# Patient Record
Sex: Male | Born: 1960 | Race: Black or African American | Hispanic: No | Marital: Married | Smoking: Never smoker
Health system: Southern US, Community
[De-identification: ages and names within clinical notes are randomized; demographics above are authoritative.]

## PROBLEM LIST (undated history)

## (undated) DIAGNOSIS — M549 Dorsalgia, unspecified: Secondary | ICD-10-CM

## (undated) DIAGNOSIS — I2699 Other pulmonary embolism without acute cor pulmonale: Secondary | ICD-10-CM

## (undated) DIAGNOSIS — N529 Male erectile dysfunction, unspecified: Secondary | ICD-10-CM

## (undated) DIAGNOSIS — I10 Essential (primary) hypertension: Secondary | ICD-10-CM

## (undated) DIAGNOSIS — R42 Dizziness and giddiness: Secondary | ICD-10-CM

## (undated) DIAGNOSIS — R7303 Prediabetes: Secondary | ICD-10-CM

## (undated) DIAGNOSIS — G4700 Insomnia, unspecified: Secondary | ICD-10-CM

## (undated) DIAGNOSIS — R9431 Abnormal electrocardiogram [ECG] [EKG]: Secondary | ICD-10-CM

## (undated) DIAGNOSIS — E7800 Pure hypercholesterolemia, unspecified: Secondary | ICD-10-CM

## (undated) DIAGNOSIS — G4733 Obstructive sleep apnea (adult) (pediatric): Secondary | ICD-10-CM

## (undated) HISTORY — PX: COLONOSCOPY: SHX174

## (undated) HISTORY — DX: Insomnia, unspecified: G47.00

## (undated) HISTORY — DX: Male erectile dysfunction, unspecified: N52.9

## (undated) HISTORY — DX: Dorsalgia, unspecified: M54.9

## (undated) HISTORY — DX: Obstructive sleep apnea (adult) (pediatric): G47.33

## (undated) HISTORY — DX: Abnormal electrocardiogram (ECG) (EKG): R94.31

## (undated) HISTORY — DX: Pure hypercholesterolemia, unspecified: E78.00

## (undated) HISTORY — DX: Dizziness and giddiness: R42

## (undated) HISTORY — DX: Prediabetes: R73.03

## (undated) HISTORY — PX: BACK SURGERY: SHX140

---

## 2008-08-05 ENCOUNTER — Encounter: Admission: RE | Admit: 2008-08-05 | Discharge: 2008-08-05 | Payer: Self-pay | Admitting: Orthopedic Surgery

## 2008-08-05 ENCOUNTER — Encounter: Payer: Self-pay | Admitting: Orthopedic Surgery

## 2008-09-18 ENCOUNTER — Encounter
Admission: RE | Admit: 2008-09-18 | Discharge: 2008-09-18 | Payer: Self-pay | Admitting: Physical Medicine and Rehabilitation

## 2010-02-07 ENCOUNTER — Encounter: Payer: Self-pay | Admitting: Orthopedic Surgery

## 2010-07-29 IMAGING — CT CT L SPINE W/O CM
4 of 7 series · 15 of 33 positions shown, 17 images · non-contrast
Comparison: MRI 08/05/2008

CLINICAL DATA: Low back pain.  Numbness and weakness of the legs.
Post discogram evaluation.

CT LUMBAR SPINE WITHOUT CONTRAST
TECHNIQUE: Multidetector CT imaging of the lumbar spine was
performed without intravenous contrast administration. Multiplanar
CT image reconstructions were also generated.

[Series 2: l-spine helical · axial · 0.27mm/px · z∈[-130,-47]mm · 4 of 56 slices shown, 5 images]
[im 12/56  soft-tissue]
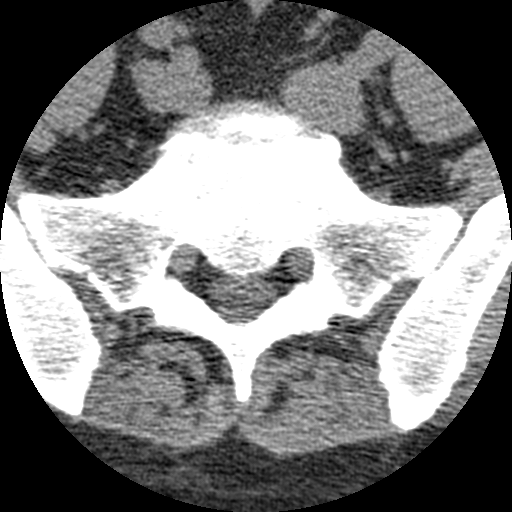
[im 12/56  bone]
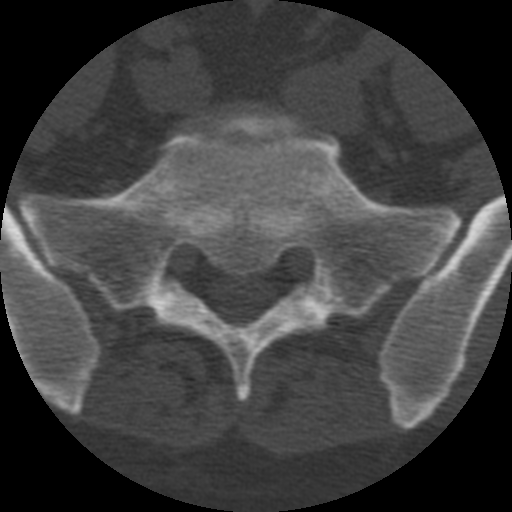
[im 23/56  bone]
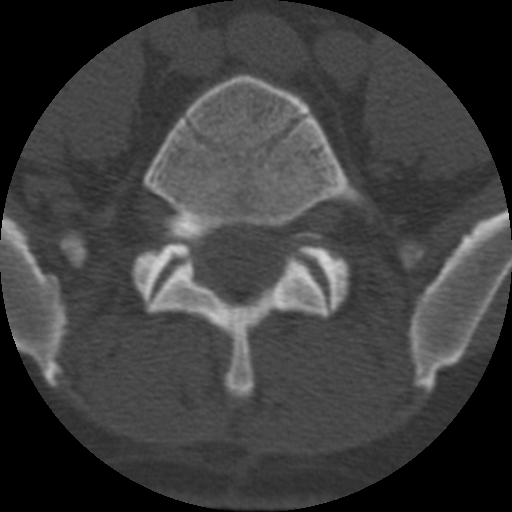
[im 34/56  bone]
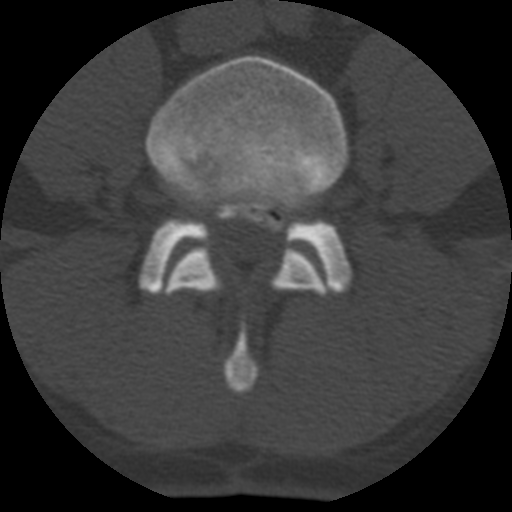
[im 45/56  bone]
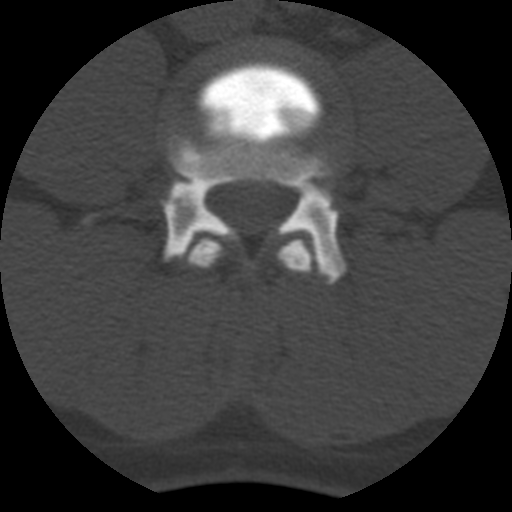

[Series 3: bone windows · axial · 0.27mm/px · z∈[-122,-52]mm · 3 of 57 slices shown]
[im 15/57  bone]
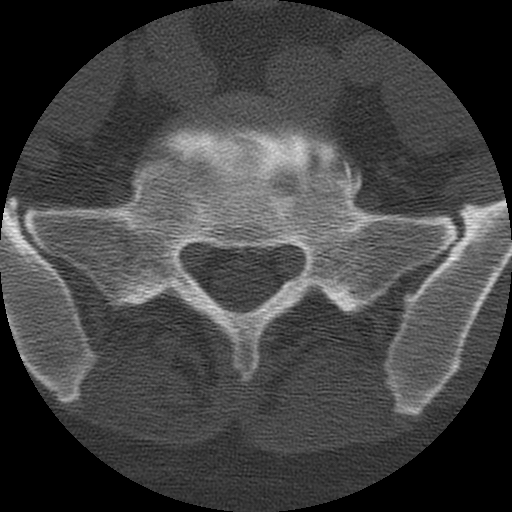
[im 29/57  bone]
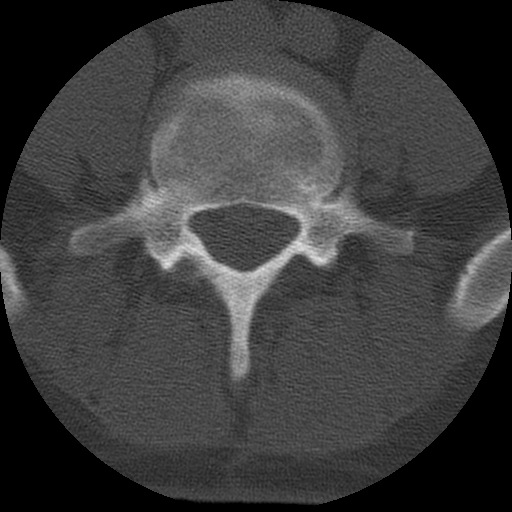
[im 43/57  bone]
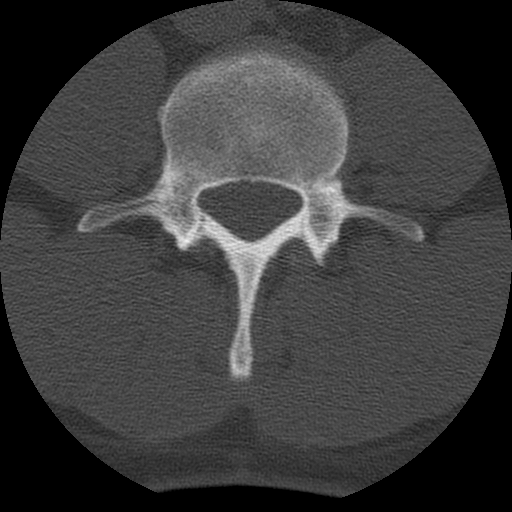

[Series 400: coronal · coronal · 0.27mm/px · 3 of 40 slices shown]
[im 8/40  bone]
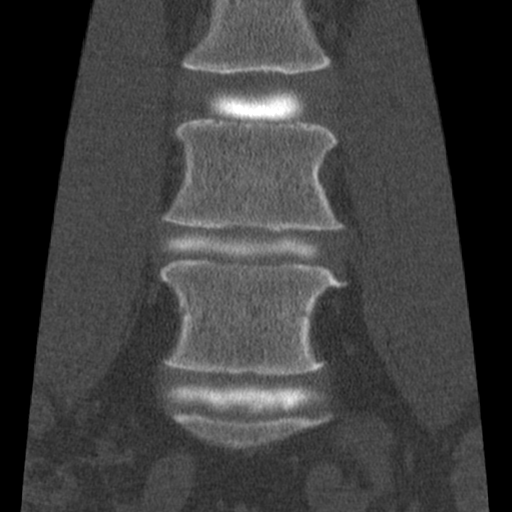
[im 16/40  bone]
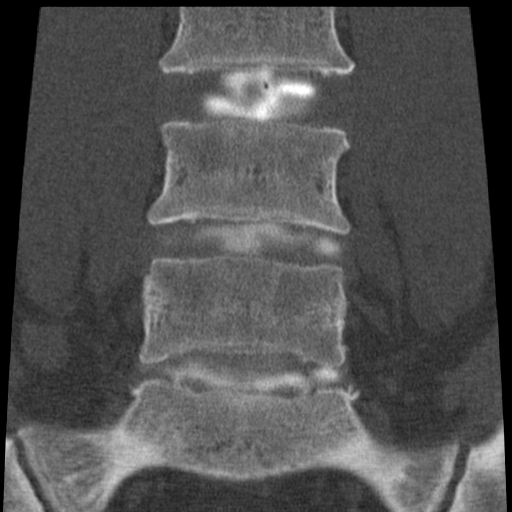
[im 24/40  bone]
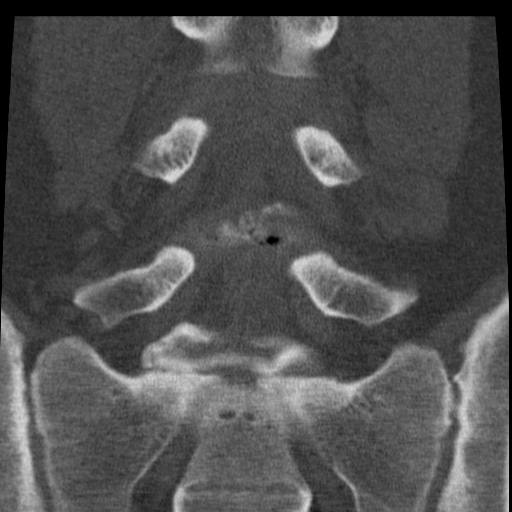

[Series 401: sagittal · sagittal · 0.27mm/px · 5 of 40 slices shown, 6 images]
[im 14/40  bone]
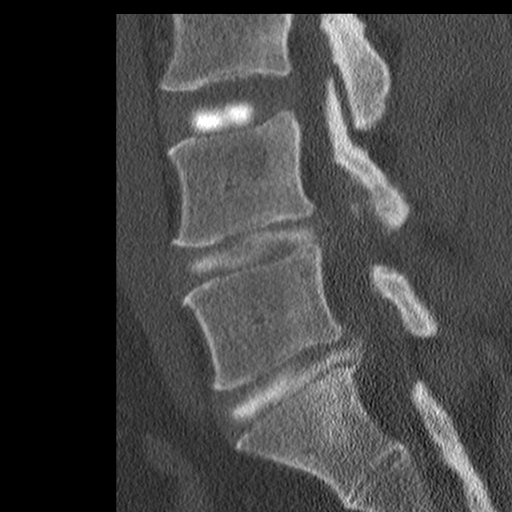
[im 17/40  bone]
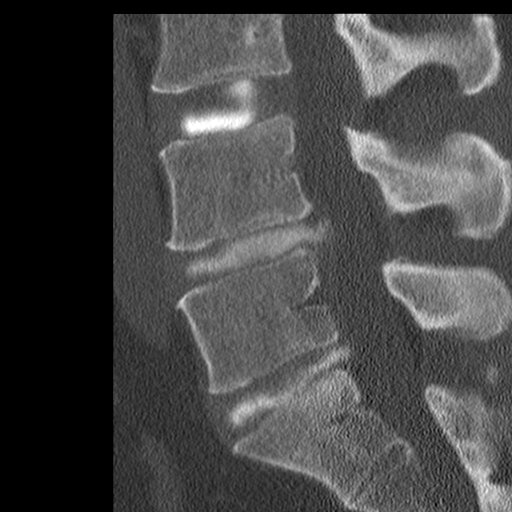
[im 20/40  soft-tissue]
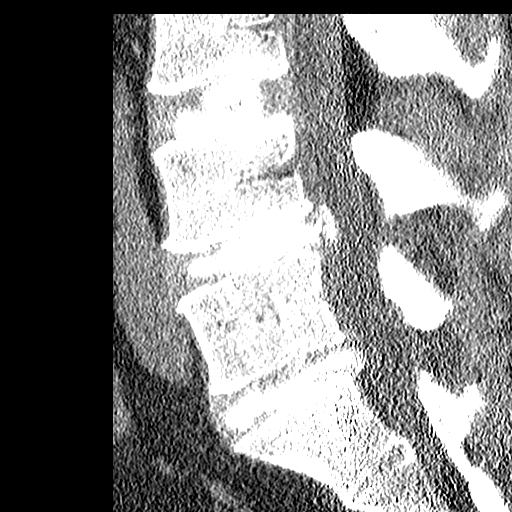
[im 20/40  bone]
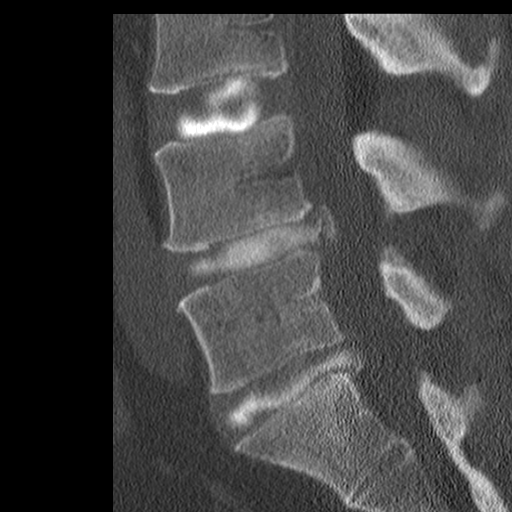
[im 23/40  bone]
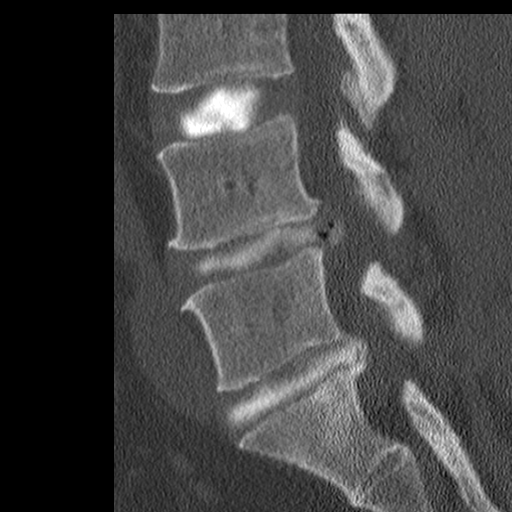
[im 27/40  bone]
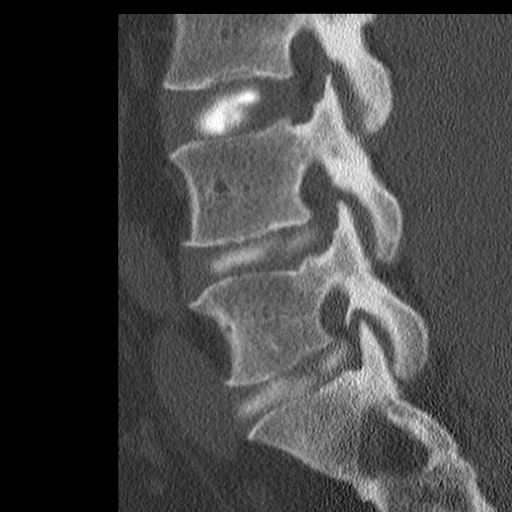

[15 of 33 positions shown; findings below may reference images not displayed]

FINDINGS: L3-4:  Normal nuclear pattern.  The canal and foramina
are widely patent.  There is mild facet degeneration.

L4-5:  This is a diffusely disrupted disc with annular tearing
posteriorly.  There is extrusion of disc material and injectate
that encroaches upon the canal.  There is mild facet degeneration.
Extrusion is more prominent towards the left.

L5-S1:  This is a diffusely disrupted disc with annular tearing and
both posterolateral direction ends.  There is extrusion of disc
material and injectate into both neural foramina, more extensive on
the right than the left.
IMPRESSION: L3-4:  Normal

L4-5:  Diffusely disrupted disc.  Posterior annular tearing with
extrusion of disc material in injectate, more towards the left.

L5-S1:  Diffusely disrupted disc.  Posterior annular tearing with
extrusion of disc material in injectate into both foraminal
regions, more extensive on the right than the left.

## 2010-12-07 ENCOUNTER — Emergency Department (INDEPENDENT_AMBULATORY_CARE_PROVIDER_SITE_OTHER): Payer: Federal, State, Local not specified - PPO

## 2010-12-07 ENCOUNTER — Emergency Department (INDEPENDENT_AMBULATORY_CARE_PROVIDER_SITE_OTHER)
Admission: EM | Admit: 2010-12-07 | Discharge: 2010-12-07 | Disposition: A | Discharge status: Home / Self Care | Payer: Federal, State, Local not specified - PPO | Source: Home / Self Care | Attending: Family Medicine | Admitting: Family Medicine

## 2010-12-07 ENCOUNTER — Other Ambulatory Visit: Payer: Self-pay

## 2010-12-07 DIAGNOSIS — R030 Elevated blood-pressure reading, without diagnosis of hypertension: Secondary | ICD-10-CM

## 2010-12-07 NOTE — ED Provider Notes (Signed)
History     CSN: [id number] Arrival date & time: 12/07/2010  6:30 PM   First MD Initiated Contact with Patient 12/07/10 1753      Chief Complaint  Patient presents with  . Shortness of Breath    (Consider location/radiation/quality/duration/timing/severity/associated sxs/prior treatment) HPI Comments: Deng presents for further evaluation of elevated blood pressure found on a screening exam; he denies any hx of HTN and had an apparently normal vitals and workup. He now endorses dyspnea on exertion; he reports poor sleep, snoring. He reports that his wife is currently treated for sleep apnea, he reports daytime sleepiness. He denies tobacco or alcohol.   Patient is a 50 y.o. male presenting with shortness of breath.  Shortness of Breath  The current episode started more than 1 week ago. The problem occurs occasionally. The problem has been unchanged. The problem is moderate. The symptoms are relieved by rest. The symptoms are aggravated by activity and a supine position. Associated symptoms include shortness of breath. Pertinent negatives include no chest pain and no chest pressure. There was no intake of a foreign body.    History reviewed. No pertinent past medical history.  Past Surgical History  Procedure Date  . Back surgery     History reviewed. No pertinent family history.  History  Substance Use Topics  . Smoking status: Never Smoker   . Smokeless tobacco: Not on file  . Alcohol Use: No      Review of Systems  Constitutional: Negative.   Eyes: Negative.   Respiratory: Positive for shortness of breath. Negative for chest tightness.   Cardiovascular: Negative for chest pain.  Genitourinary: Negative.   Musculoskeletal: Negative.   Neurological: Positive for headaches.    Allergies  Review of patient's allergies indicates no known allergies.  Home Medications  No current outpatient prescriptions on file.  BP 155/90  Pulse 76  Temp(Src) 98.9 F (37.2 C)  (Oral)  Resp 20  SpO2 100%  Physical Exam  Constitutional: He is oriented to person, place, and time. He appears well-developed and well-nourished.  HENT:  Head: Normocephalic and atraumatic.  Right Ear: Tympanic membrane normal.  Left Ear: Tympanic membrane normal.  Mouth/Throat: Uvula is midline and oropharynx is clear and moist.  Eyes: EOM are normal.  Neck: Normal range of motion.  Cardiovascular: Normal rate and regular rhythm.   Pulmonary/Chest: Effort normal and breath sounds normal. He has no wheezes. He has no rhonchi.  Neurological: He is alert and oriented to person, place, and time.  Skin: Skin is warm and dry.    ED Course  Procedures (including critical care time)  Labs Reviewed - No data to display Dg Chest 2 View  12/07/2010  *RADIOLOGY REPORT*  Clinical Data: Shortness of breath  CHEST - 2 VIEW  Comparison: None.  Findings: Low lung volumes.  Lungs are essentially clear. No pleural effusion or pneumothorax.  Cardiomediastinal silhouette is within normal limits.  Visualized osseous structures are within normal limits.  IMPRESSION: No evidence of acute cardiopulmonary disease.  Original Report Authenticated By: Charline Bills, M.D.     No diagnosis found.    MDM  CXR: no acute findings or disease ECG: NSR, rate 62, no acute ST-T changes        Richardo Priest, MD 12/07/10 2115

## 2010-12-07 NOTE — ED Notes (Signed)
Patient states he is easy to get SOB past couple of weeks or so, worse when he exerts himself; his BP , CBG were elevated at an office sponsored health fair earlier today, aprox 2 h after consuming breakfast combo at [employer]

## 2012-10-16 IMAGING — CR DG CHEST 2V
2 series · 2 of 2 positions shown · non-contrast
Comparison: None.

CLINICAL DATA: Shortness of breath

CHEST - 2 VIEW

[view not recorded (1 of 2)]
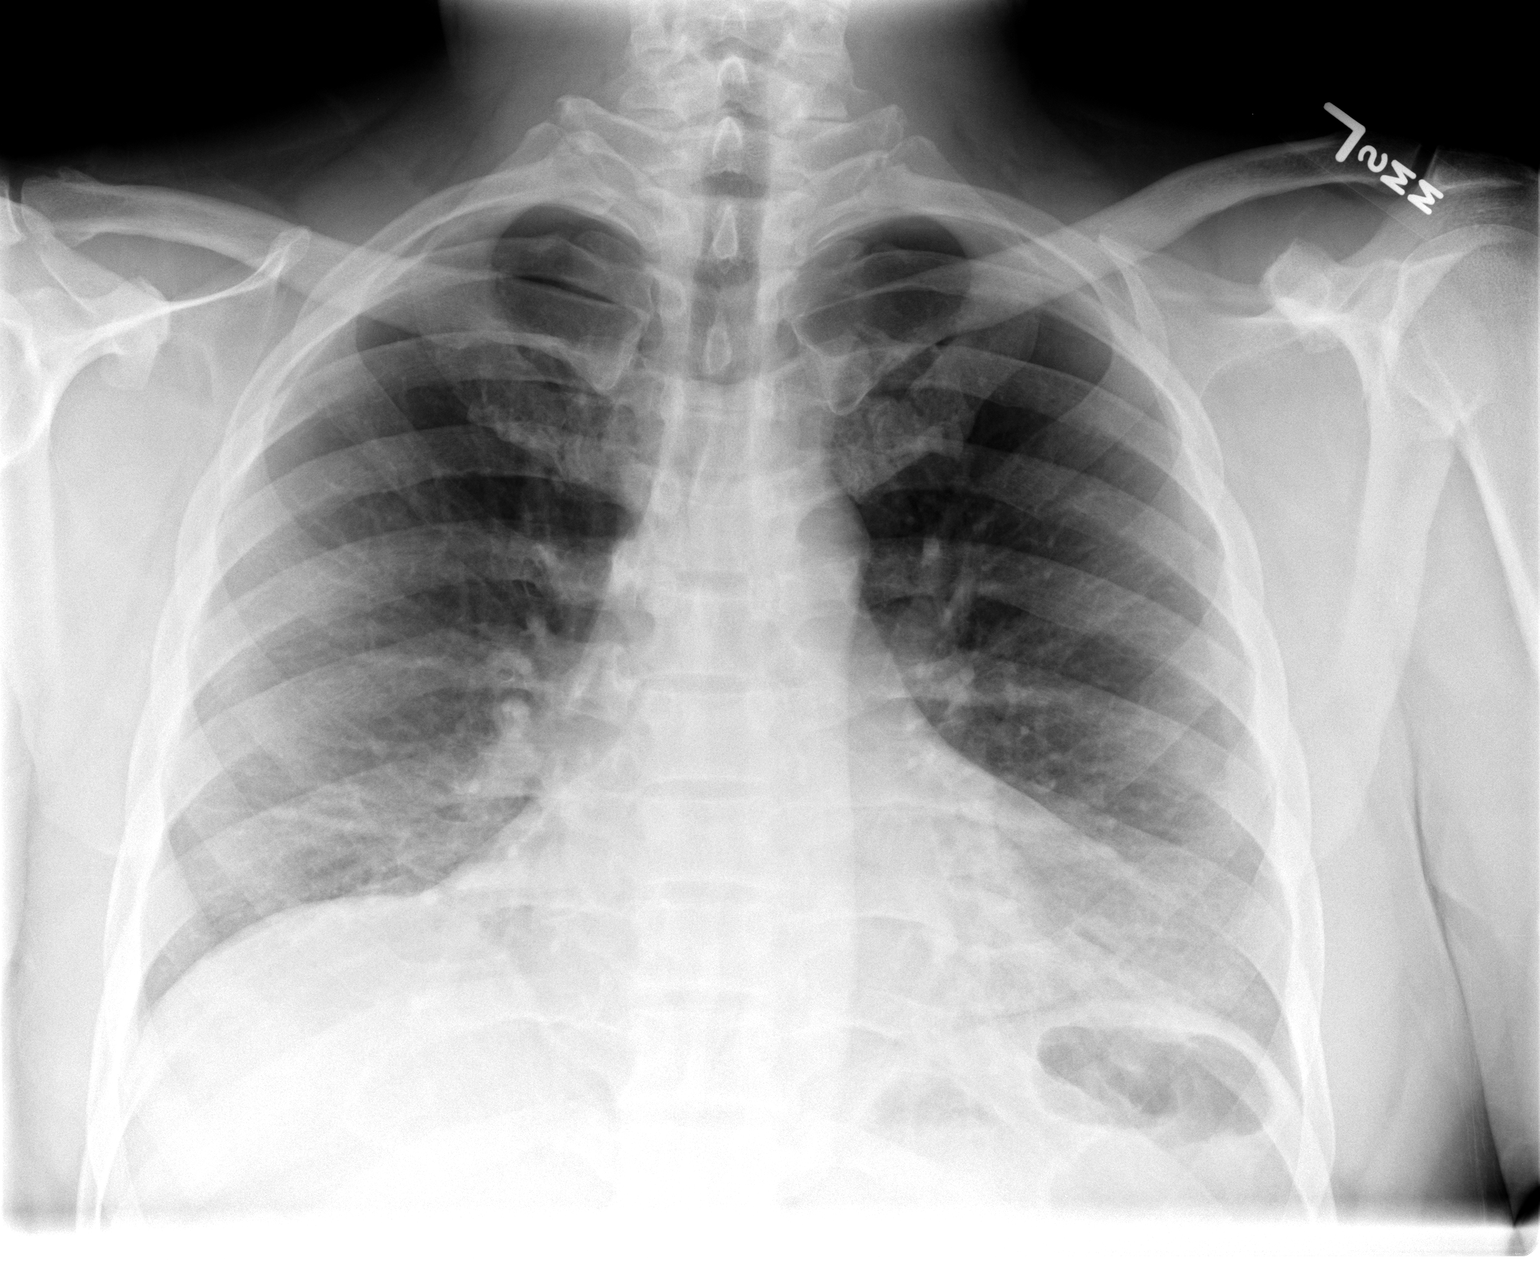

[view not recorded (2 of 2)]
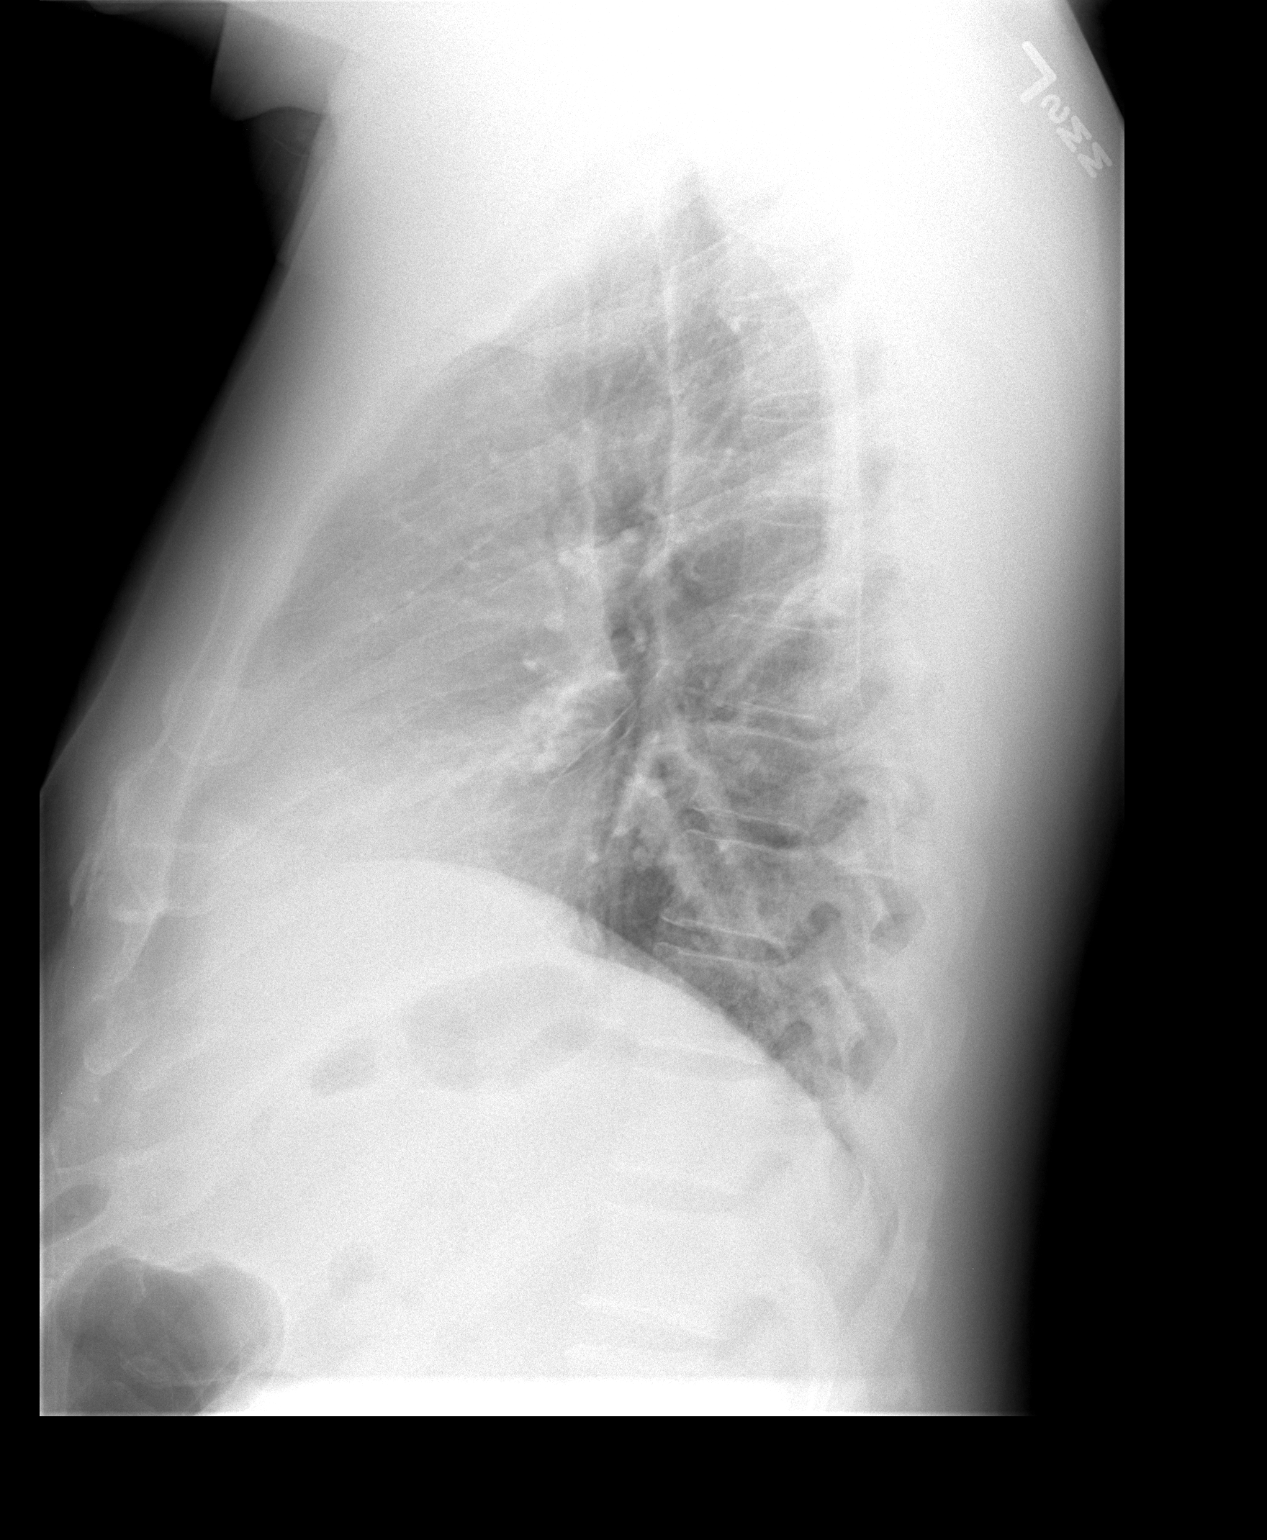

[2 of 2 positions shown; findings below may reference images not displayed]

FINDINGS: Low lung volumes.  Lungs are essentially clear. No
pleural effusion or pneumothorax.

Cardiomediastinal silhouette is within normal limits.

Visualized osseous structures are within normal limits.
IMPRESSION: No evidence of acute cardiopulmonary disease.

## 2014-11-02 ENCOUNTER — Emergency Department (HOSPITAL_COMMUNITY)
Admission: EM | Admit: 2014-11-02 | Discharge: 2014-11-02 | Disposition: A | Discharge status: Home / Self Care | Payer: Worker's Compensation | Attending: Emergency Medicine | Admitting: Emergency Medicine

## 2014-11-02 ENCOUNTER — Encounter (HOSPITAL_COMMUNITY): Payer: Self-pay | Admitting: Emergency Medicine

## 2014-11-02 DIAGNOSIS — Y9389 Activity, other specified: Secondary | ICD-10-CM | POA: Insufficient documentation

## 2014-11-02 DIAGNOSIS — I10 Essential (primary) hypertension: Secondary | ICD-10-CM | POA: Insufficient documentation

## 2014-11-02 DIAGNOSIS — S61236A Puncture wound without foreign body of right little finger without damage to nail, initial encounter: Secondary | ICD-10-CM | POA: Insufficient documentation

## 2014-11-02 DIAGNOSIS — Y9289 Other specified places as the place of occurrence of the external cause: Secondary | ICD-10-CM | POA: Diagnosis not present

## 2014-11-02 DIAGNOSIS — Z79899 Other long term (current) drug therapy: Secondary | ICD-10-CM | POA: Diagnosis not present

## 2014-11-02 DIAGNOSIS — W461XXA Contact with contaminated hypodermic needle, initial encounter: Secondary | ICD-10-CM | POA: Diagnosis not present

## 2014-11-02 DIAGNOSIS — Y998 Other external cause status: Secondary | ICD-10-CM | POA: Insufficient documentation

## 2014-11-02 DIAGNOSIS — Z7721 Contact with and (suspected) exposure to potentially hazardous body fluids: Secondary | ICD-10-CM

## 2014-11-02 HISTORY — DX: Essential (primary) hypertension: I10

## 2014-11-02 LAB — RAPID HIV SCREEN (HIV 1/2 AB+AG)
HIV 1/2 ANTIBODIES: NONREACTIVE
HIV-1 P24 ANTIGEN - HIV24: NONREACTIVE
INTERPRETATION (HIV AG AB): NONREACTIVE

## 2014-11-02 LAB — CBG MONITORING, ED
GLUCOSE-CAPILLARY: 114 mg/dL — AB (ref 65–99)
Glucose-Capillary: 78 mg/dL (ref 65–99)

## 2014-11-02 NOTE — ED Provider Notes (Signed)
CSN: [id number]     Arrival date & time 11/02/14  1016 History  By signing my name below, I, Wayne Cox, attest that this documentation has been prepared under the direction and in the presence of Wayne BaptistEmily Roe Nguyen, MD. Electronically Signed: Ronney LionSuzanne Cox, ED Scribe. 11/02/2014. 11:22 AM.   Chief Complaint  Patient presents with  . Body Fluid Exposure   The history is provided by the patient. No language interpreter was used.    HPI Comments: Wayne Cox is a 54 y.o. male with a history of hypertension, who presents to the Emergency Department complaining of accidentally getting stuck by a needle this morning. 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 He does not know anything about the tenant's past medical history but does state she is elderly. He continues to state that he is scared about the possibility of disease transmission.  Past Medical History  Diagnosis Date  . Hypertension    Past Surgical History  Procedure Laterality Date  . Back surgery     No family history on file. Social History  Substance Use Topics  . Smoking status: Never Smoker   . Smokeless tobacco: None  . Alcohol Use: No    Review of Systems  All other systems reviewed and are negative.  Allergies  Review of patient's allergies indicates no known allergies.  Home Medications   Prior to Admission medications   Medication Sig Start Date End Date Taking? Authorizing Provider  amLODipine (NORVASC) 10 MG tablet Take 1 tablet by mouth daily. 10/27/14  Yes Historical Provider, MD  Eszopiclone 3 MG TABS Take 1 tablet by mouth daily as needed (sleep).  10/29/14  Yes Historical Provider, MD  losartan (COZAAR) 100 MG tablet Take 1 tablet by mouth daily. 10/27/14  Yes Historical Provider, MD   BP 152/86 mmHg  Pulse 83  Temp(Src) 97.7 F  (36.5 C) (Oral)  Resp 18  Ht 6\' 4"  (1.93 m)  Wt 240 lb (108.863 kg)  BMI 29.23 kg/m2  SpO2 100% Physical Exam  Constitutional: He is oriented to person, place, and time. He appears well-developed and well-nourished. No distress.  HENT:  Head: Normocephalic and atraumatic.  Eyes: Conjunctivae and EOM are normal.  Neck: Neck supple. No tracheal deviation present.  Cardiovascular: Normal rate.   Pulmonary/Chest: Effort normal. No respiratory distress.  Musculoskeletal: Normal range of motion.  Neurological: He is alert and oriented to person, place, and time.  Skin: Skin is warm and dry.  Small puncture wound on right pinky finger  Psychiatric: He has a normal mood and affect. His behavior is normal.  Nursing note and vitals reviewed.   ED Course  Procedures (including critical care time)  DIAGNOSTIC STUDIES: Oxygen Saturation is 99% on RA, normal by my interpretation.    COORDINATION OF CARE: 11:20 AM - Reassured pt. Discussed treatment plan with pt at bedside which includes obtaining bloodwork from both patient and the tenant whose needle stuck him. Pt verbalized understanding and agreed to plan.    11:24 AM - Phone call with tenant in question, Ms. Wayne Cox(sp?), on the phone, as patient has her number on his phone. She notes a past medical history of DM. She denies any other known chronic medical conditions, including HIV or Hepatitis B. Patient reports she is willing to have labs drawn.  Labs Review Labs  Reviewed  CBG MONITORING, ED - Abnormal; Notable for the following:    Glucose-Capillary 114 (*)    All other components within normal limits  RAPID HIV SCREEN (HIV 1/2 AB+AG)  HEPATITIS PANEL, ACUTE  CBG MONITORING, ED    Imaging Review No results found. I have personally reviewed and evaluated these images and lab results as part of my medical decision-making.   EKG Interpretation None      MDM  Patient was seen and evaluated in stable condition.  Small  puncture wound.  Blood glucose stable.  Discussed with patient supervisor on the phone who said source patient could be brought for blood draw and that company would cover it.  Discussed with source patient on the phone her medical history and she denied history other than DM - denied HIV, hepatitis.  She was agreeable to come for blood work.  Patient low risk for disease transmission of any kind.  No prophylaxis given.  Patient instructed to follow up outpatient.  He stated that either he or his supervisor would bring source patient back for blood work.  Patient's HIV negative, hepatitis panel pending.  Patient without indication for prophylaxis.  Patient discharged home in stable condition. Final diagnoses:  Exposure to blood or body fluid     I personally performed the services described in this documentation, which was scribed in my presence. The recorded information has been reviewed and is accurate.      Wayne Baptist, MD 11/02/14 2215

## 2014-11-02 NOTE — ED Notes (Signed)
Pt had an accidental needle stick to RT pinky finger. Pt states he was cleaning one of his resident's fridge when he was stuck by a Novolog Flexi Pen. Pt states he does think any medication was injected. Pt AOx4. CBG 114.

## 2014-11-02 NOTE — Discharge Instructions (Signed)
Body Fluid Exposure Information  Make sure that the person whose needle it was is taken to have blood drawn.  People may come into contact with blood and other body fluids under various circumstances. In some cases, body fluids may contain germs (bacteria or viruses) that cause infections. These germs can be spread when another person's body fluids come into contact with your skin, mouth, eyes, or genitals.  Exposure to body fluids that may contain infectious material is a common problem for people providing care for others who are ill. It can occur when a person is performing health care tasks in the workplace or when taking care of a family member at home. Other common methods of exposure include injection drug use, sharing needles, and sexual activity. The risk of an infection spreading through body fluid exposure is small and depends on a variety of factors. This includes the type of body fluid, the nature of the exposure, and the health status of the person who was the source of the body fluids. Your health care provider can help you assess the risk. WHAT TYPES OF BODY FLUID CAN SPREAD INFECTION? The following types of body fluid have the potential to spread infections:  Blood.  Semen.  Vaginal secretions.  Urine.  Feces.  Saliva.  Nasal or eye discharge.  Breast milk.  Amniotic fluid and fluids surrounding body organs. WHAT ARE SOME FIRST-AID MEASURES FOR BODY FLUID EXPOSURE? The following steps should be taken as soon as possible after a person is exposed to body fluids: Intact Skin  For contact with closed skin, wash the area with soap and water. Broken Skin  For contact with broken skin (a wound), wash the area with soap and water. Let the area bleed a little. Then place a bandage or clean towel on the wound, applying gentle pressure to stop the bleeding. Do not squeeze or rub the area.  Use just water or hand sanitizer if a sink with soap is not available.  Do not use  harsh chemicals such as bleach or iodine. Eyes  Rinse the eyes with water or saline for 30 seconds.  If the person is wearing contact lenses, leave the contact lenses in while rinsing the eyes. Once the rinsing is complete, remove the contact lenses. Mouth  Spit out the fluids. Rinse and spit with water 4-5 times. In addition, you should remove any clothing that comes into contact with body fluids. However, if body fluid exposure results from sexual assault, seek medical care immediately without changing clothes or bathing. WHEN SHOULD YOU SEEK HELP? After performing the proper first-aid steps, you should contact your health care provider or seek emergency care right away if blood or other body fluids made contact with areas of broken skin or openings such as the eyes or mouth. If the exposure to body fluid happened in the workplace, you should report it to your work supervisor immediately. Many workplaces have procedures in place for exposure situations. WHAT WILL HAPPEN AFTER YOU REPORT THE EXPOSURE? Your health care provider will ask you several questions. Information requested may include:  Your medical history, including vaccination records.  Date and time of the exposure.  Whether you saw body fluids during the exposure.  Type of body fluid you were exposed to.  Volume of body fluid you were exposed to.  How the exposure happened.  If any devices, such as a needle, were being used.  Which area of your body made contact with the body fluid.  Description of  any injury to the skin or other area.  How long contact was made with the body fluid.  Any information you have about the health status of the person whose body fluid you were exposed to. The health care provider will assess your risk of infection. Often, no treatment is necessary. In some cases, the health care provider may recommend doing blood tests right away. Follow-up blood tests may also be done at certain intervals  during the upcoming weeks and months to check for changes. You may be offered treatment to prevent an infection from developing after exposure (post-exposure prophylaxis). This may include certain vaccinations or medicines and may be necessary when there is a risk of a serious infection, such as HIV or hepatitis B. Your health care provider should discuss appropriate treatment and vaccinations with you. HOW CAN YOU PREVENT EXPOSURE AND INFECTION? Always remember that prevention is the first line of defense against body fluid exposure. To help prevent exposure to body fluids:  Wash and disinfect countertops and other surfaces regularly.  Wear appropriate protective gear such as gloves, gowns, or eyewear when the possibility of exposure is present.  Wipe away spills of body fluid with disposable towels.  Properly dispose of blood products and other fluids. Use secured bags.  Properly dispose of needles and other instruments with sharp points or edges (sharps). Use closed, marked containers.  Avoid injection drug use.  Do not share needles.  Avoid recapping needles.  Use a condom during sexual intercourse.  Make sure you learn and follow any guidelines for preventing exposure (universal precautions) provided at your workplace. To help reduce your chances of getting an infection:  Make sure your vaccinations are up-to-date, including those for tetanus and hepatitis.  Wash your hands frequently with soap and water. Use hand sanitizers.  Avoid having multiple sex partners.  Follow up with your health care provider as directed after being evaluated for an exposure to body fluids. To avoid spreading infection to others:  Do not have sexual relations until you know you are free of infection.  Do not donate blood, plasma, breast milk, sperm, or other body fluids.  Do not share hygiene tools such as toothbrushes, razors, or dental floss.  Keep open wounds covered.  Dispose of any items  with blood on them (razors, tampons, bandages) by putting them in the trash.  Do not share drug supplies with others, such as needles, syringes, straws, or pipes.  Follow all of your health care provider's instructions for preventing the spread of infection.   This information is not intended to replace advice given to you by your health care provider. Make sure you discuss any questions you have with your health care provider.   Document Released: 09/04/2012 Document Revised: 01/07/2013 Document Reviewed: 09/04/2012 Elsevier Interactive Patient Education [employer]

## 2014-11-02 NOTE — ED Notes (Signed)
Pts supervisor contacted in regards to tenant lab work. Tenant was at facility that the exposure came from. Insulin syringe exposure

## 2014-11-03 LAB — HEPATITIS PANEL, ACUTE
HCV Ab: <0.1 s/co ratio (ref 0.0–0.9)
HEP A IGM: UNDETERMINED
HEP B S AG: NEGATIVE
Hep B C IgM: NEGATIVE

## 2018-08-01 ENCOUNTER — Other Ambulatory Visit: Payer: Self-pay

## 2018-08-01 DIAGNOSIS — Z20822 Contact with and (suspected) exposure to covid-19: Secondary | ICD-10-CM

## 2018-08-06 LAB — NOVEL CORONAVIRUS, NAA: SARS-CoV-2, NAA: NOT DETECTED

## 2018-08-22 ENCOUNTER — Other Ambulatory Visit: Payer: Self-pay

## 2018-08-22 DIAGNOSIS — Z20822 Contact with and (suspected) exposure to covid-19: Secondary | ICD-10-CM

## 2018-08-24 LAB — NOVEL CORONAVIRUS, NAA: SARS-CoV-2, NAA: NOT DETECTED

## 2018-08-27 ENCOUNTER — Telehealth: Payer: Self-pay | Admitting: General Practice

## 2018-08-27 NOTE — Telephone Encounter (Signed)
Negative COVID results given. Patient results "NOT Detected." Caller expressed understanding. ° °

## 2021-03-29 DIAGNOSIS — I10 Essential (primary) hypertension: Secondary | ICD-10-CM | POA: Diagnosis not present

## 2021-03-29 DIAGNOSIS — E7800 Pure hypercholesterolemia, unspecified: Secondary | ICD-10-CM | POA: Diagnosis not present

## 2021-03-29 DIAGNOSIS — R7303 Prediabetes: Secondary | ICD-10-CM | POA: Diagnosis not present

## 2021-06-16 DIAGNOSIS — I10 Essential (primary) hypertension: Secondary | ICD-10-CM | POA: Diagnosis not present

## 2021-06-16 DIAGNOSIS — N529 Male erectile dysfunction, unspecified: Secondary | ICD-10-CM | POA: Diagnosis not present

## 2021-06-16 DIAGNOSIS — R7303 Prediabetes: Secondary | ICD-10-CM | POA: Diagnosis not present

## 2021-06-16 DIAGNOSIS — E7800 Pure hypercholesterolemia, unspecified: Secondary | ICD-10-CM | POA: Diagnosis not present

## 2021-08-17 DIAGNOSIS — R7303 Prediabetes: Secondary | ICD-10-CM | POA: Diagnosis not present

## 2021-08-17 DIAGNOSIS — I10 Essential (primary) hypertension: Secondary | ICD-10-CM | POA: Diagnosis not present

## 2021-08-17 DIAGNOSIS — R42 Dizziness and giddiness: Secondary | ICD-10-CM | POA: Diagnosis not present

## 2021-08-17 DIAGNOSIS — E7800 Pure hypercholesterolemia, unspecified: Secondary | ICD-10-CM | POA: Diagnosis not present

## 2021-09-13 DIAGNOSIS — I10 Essential (primary) hypertension: Secondary | ICD-10-CM | POA: Diagnosis not present

## 2021-09-13 DIAGNOSIS — G479 Sleep disorder, unspecified: Secondary | ICD-10-CM | POA: Diagnosis not present

## 2021-09-13 DIAGNOSIS — Z0000 Encounter for general adult medical examination without abnormal findings: Secondary | ICD-10-CM | POA: Diagnosis not present

## 2021-09-14 ENCOUNTER — Ambulatory Visit
Payer: Federal, State, Local not specified - PPO | Attending: Interventional Cardiology | Admitting: Interventional Cardiology

## 2021-09-14 ENCOUNTER — Encounter: Payer: Self-pay | Admitting: Interventional Cardiology

## 2021-09-14 VITALS — BP 90/50 | HR 74 | Ht 76.0 in | Wt 247.0 lb

## 2021-09-14 DIAGNOSIS — R0609 Other forms of dyspnea: Secondary | ICD-10-CM | POA: Diagnosis not present

## 2021-09-14 DIAGNOSIS — R400 Somnolence: Secondary | ICD-10-CM

## 2021-09-14 DIAGNOSIS — R0683 Snoring: Secondary | ICD-10-CM

## 2021-09-14 NOTE — Progress Notes (Signed)
Cardiology Office Note   Date:  09/14/2021   ID:  Wayne, Cox [birth year], MRN [id number]  PCP:  Merri Brunette, MD    No chief complaint on file.  Abnormal ECG  Wt Readings from Last 3 Encounters:  09/14/21 247 lb (112 kg)  11/02/14 240 lb (108.9 kg)       History of Present Illness: Wayne Cox is a 61 y.o. male who is being seen today for the evaluation of dizzy, abnormal ECG at the request of Wilfrid Lund, PA.   H/o HTN.   Reports no energy.  Walking a short distance will result in dizziness.    He feels tired today after taking a sleeping pill last night.    He feels some DOE.  No chest pain.    No prior stress test.   Denies : exertional Chest pain. Dizziness. Leg edema. Nitroglycerin use. Orthopnea. Palpitations. Paroxysmal nocturnal dyspnea. Syncope.    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  Has some DOE.     Past Medical History:  Diagnosis Date   Abnormal EKG    Dizziness    ED (erectile dysfunction)    Hypercholesterolemia    Hypertension    Insomnia    Prediabetes    Upper back pain     Past Surgical History:  Procedure Laterality Date   BACK SURGERY       Current Outpatient Medications  Medication Sig Dispense Refill   amLODipine (NORVASC) 10 MG tablet Take 1 tablet by mouth daily.     cyclobenzaprine (FLEXERIL) 10 MG tablet Take 10 mg by mouth 3 (three) times daily as needed for muscle spasms.     Eszopiclone 3 MG TABS Take 1 tablet by mouth daily as needed (sleep).      losartan (COZAAR) 100 MG tablet Take 1 tablet by mouth daily.     olmesartan-hydrochlorothiazide (BENICAR HCT) 40-25 MG tablet Take 1 tablet by mouth daily.     tadalafil (CIALIS) 10 MG tablet Take 10 mg by mouth daily as needed for erectile dysfunction.     valsartan-hydrochlorothiazide (DIOVAN-HCT) 320-25 MG tablet Take 1 tablet by mouth daily.     No current facility-administered medications for this visit.    Allergies:    Patient has no known allergies.    Social History:  The patient  reports that he has never smoked. He does not have any smokeless tobacco history on file. He reports that he does not drink alcohol and does not use drugs.   Family History:  The patient's family history includes Cancer in his brother, father, and sister; Diabetes in his brother; Hypertension in his brother; Kidney disease in his brother.    ROS:  Please see the history of present illness.   Otherwise, review of systems are positive for fatigue.   All other systems are reviewed and negative.    PHYSICAL EXAM: VS:  BP (!) 90/50 (BP Location: Left Arm, Patient Position: Sitting, Cuff Size: Normal)   Pulse 74   Ht 6\' 4"  (1.93 m)   Wt 247 lb (112 kg)   BMI 30.07 kg/m  , BMI Body mass index is 30.07 kg/m. GEN: Well nourished, well developed, in no acute distress HEENT: normal Neck: no JVD, carotid bruits, or masses Cardiac: RRR; no murmurs, rubs, or gallops,no edema  Respiratory:  clear to auscultation bilaterally, normal work of breathing GI: soft, nontender, nondistended, + BS MS: no deformity or atrophy Skin: warm and dry, no  rash Neuro:  Strength and sensation are intact Psych: euthymic mood, full affect   EKG:   The ekg ordered today demonstrates normal sinus rhythm nonspecific ST-T wave changes, suggestive of LVH   Recent Labs: No results found for requested labs within last 365 days.   Lipid Panel No results found for: "CHOL", "TRIG", "HDL", "CHOLHDL", "VLDL", "LDLCALC", "LDLDIRECT"   Other studies Reviewed: Additional studies/ records that were reviewed today with results demonstrating: Labs reviewed.   ASSESSMENT AND PLAN:  DOE/Abnl ECG: Nonspecific ST changes.  Check an echocardiogram to evaluate for structural heart disease. Fatigue/daytime somnolence/difficulty sleeping: Multiple siblings he has sleep apnea.We will check sleep test. HTN: Amlodipine 10, Spironolactone 50 mg daily, Olmesartan/HCTZ  40-25 mg daily on his most recent visit with PMD.  Improved of late.  With lower blood pressure reading today, will stop amlodipine.  If blood pressure increases to > 140 mm Hg, add back amlodipine 5 g once a day. Prediabetes: A1c 5.9.  Increase exercise to a target of 150 minutes/week.  Eat a whole food, plant-based diet.  High-fiber diet.  Avoid processed foods.  Avoid sodas.     Current medicines are reviewed at length with the patient today.  The patient concerns regarding his medicines were addressed.  The following changes have been made:  No change  Labs/ tests ordered today include: echo, sleep study. No orders of the defined types were placed in this encounter.   Recommend 150 minutes/week of aerobic exercise Low fat, low carb, high fiber diet recommended  Disposition:   FU for tests   Signed, Lance Muss, MD  09/14/2021 3:03 PM    [facility] [address] [place], [place], [place]  [address] Phone: [phone]; Fax: [phone]

## 2021-09-14 NOTE — Patient Instructions (Addendum)
Medication Instructions:  Your physician has recommended you make the following change in your medication: Stop Amlodipine.  *If you need a refill on your cardiac medications before your next appointment, please call your pharmacy*   Lab Work: none If you have labs (blood work) drawn today and your tests are completely normal, you will receive your results only by: MyChart Message (if you have MyChart) OR A paper copy in the mail If you have any lab test that is abnormal or we need to change your treatment, we will call you to review the results.   Testing/Procedures: Your physician has recommended that you have a sleep study. This test records several body functions during sleep, including: brain activity, eye movement, oxygen and carbon dioxide blood levels, heart rate and rhythm, breathing rate and rhythm, the flow of air through your mouth and nose, snoring, body muscle movements, and chest and belly movement.  Your physician has requested that you have an echocardiogram. Echocardiography is a painless test that uses sound waves to create images of your heart. It provides your doctor with information about the size and shape of your heart and how well your heart's chambers and valves are working. This procedure takes approximately one hour. There are no restrictions for this procedure.    Follow-Up: At [facility], you and your health needs are our priority.  As part of our continuing mission to provide you with exceptional heart care, we have created designated Provider Care Teams.  These Care Teams include your primary Cardiologist (physician) and Advanced Practice Providers (APPs -  Physician Assistants and Nurse Practitioners) who all work together to provide you with the care you need, when you need it.  We recommend signing up for the patient portal called "MyChart".  Sign up information is provided on this After Visit Summary.  MyChart is used to connect with patients for  Virtual Visits (Telemedicine).  Patients are able to view lab/test results, encounter notes, upcoming appointments, etc.  Non-urgent messages can be sent to your provider as well.   To learn more about what you can do with MyChart, go to [email or web address].    Your next appointment:   Based on results  The format for your next appointment:   In Person  Provider:   Lance Muss, MD     Other Instructions  Check blood pressure at home and keep record of readings. If top number of reading is consistently greater than 140 resume amlodipine at 5 mg by mouth daily.  Let us know if you start amlodipine so we can update your records.   Important Information About Sugar

## 2021-09-26 ENCOUNTER — Ambulatory Visit (HOSPITAL_COMMUNITY): Payer: Federal, State, Local not specified - PPO | Attending: Interventional Cardiology

## 2021-09-26 DIAGNOSIS — R0609 Other forms of dyspnea: Secondary | ICD-10-CM | POA: Insufficient documentation

## 2021-09-26 LAB — ECHOCARDIOGRAM COMPLETE
Area-P 1/2: 2.65 cm2
S' Lateral: 2.7 cm

## 2021-11-28 ENCOUNTER — Ambulatory Visit (HOSPITAL_BASED_OUTPATIENT_CLINIC_OR_DEPARTMENT_OTHER): Payer: Federal, State, Local not specified - PPO | Attending: Interventional Cardiology | Admitting: Cardiology

## 2021-11-28 VITALS — Ht 76.0 in | Wt 240.0 lb

## 2021-11-28 DIAGNOSIS — R400 Somnolence: Secondary | ICD-10-CM | POA: Diagnosis not present

## 2021-11-28 DIAGNOSIS — R0683 Snoring: Secondary | ICD-10-CM | POA: Insufficient documentation

## 2021-11-28 DIAGNOSIS — G4733 Obstructive sleep apnea (adult) (pediatric): Secondary | ICD-10-CM | POA: Diagnosis not present

## 2021-11-29 ENCOUNTER — Telehealth: Payer: Self-pay | Admitting: *Deleted

## 2021-11-29 NOTE — Telephone Encounter (Signed)
-----   Message from Gaynelle Cage, CMA sent at 11/29/2021  1:52 PM EST -----  ----- Message ----- From: Quintella Reichert, MD Sent: 11/29/2021   9:18 AM EST To: Cv Div Sleep Studies  Please let patient know that they have sleep apnea with successful PAP titration.  PAP ordered placed in Epic.  Followup in 6 weeks after starting PAP therapy

## 2021-11-29 NOTE — Procedures (Signed)
Patient Name: Wayne Cox, Wayne Cox Date: 11/28/2021 Gender: Male D.O.B: 1960/01/25 Age (years): 61 Referring Provider: Larae Grooms Height (inches): 76 Interpreting Physician: Fransico Him MD, ABSM Weight (lbs): 240 RPSGT: Gwenyth Allegra BMI: 29 MRN: [id number] Neck Size: 18.00  CLINICAL INFORMATION Sleep Study Type: Split Night CPAP  Indication for sleep study: Hypertension  Epworth Sleepiness Score: 12  SLEEP STUDY TECHNIQUE As per the AASM Manual for the Scoring of Sleep and Associated Events v2.3 (April 2016) with a hypopnea requiring 4% desaturations.  The channels recorded and monitored were frontal, central and occipital EEG, electrooculogram (EOG), submentalis EMG (chin), nasal and oral airflow, thoracic and abdominal wall motion, anterior tibialis EMG, snore microphone, electrocardiogram, and pulse oximetry. Continuous positive airway pressure (CPAP) was initiated when the patient met split night criteria and was titrated according to treat sleep-disordered breathing.  MEDICATIONS Medications self-administered by patient taken the night of the study : N/A  RESPIRATORY PARAMETERS Diagnostic Total AHI (/hr): 25.7  RDI (/hr):33.6  OA Index (/hr):2.3  CA Index (/hr): 0.9 REM AHI (/hr):20.7  NREM AHI (/hr):26.4  Supine AHI (/hr):48.2  Non-supine AHI (/hr):12.4 Min O2 Sat (%):82.0  Mean O2 (%): 93.2  Time below 88% (min):2.8   Titration Optimal Pressure (cm):13  AHI at Optimal Pressure (/hr):0  Min O2 at Optimal Pressure (%):94.0 Supine % at Optimal (%):100  Sleep % at Optimal (%):25   SLEEP ARCHITECTURE The recording time for the entire night was 366 minutes.  During a baseline period of 179.1 minutes, the patient slept for 130.5 minutes in REM and nonREM, yielding a sleep efficiency of 72.9%. Sleep onset after lights out was 7.9 minutes with a REM latency of 153.0 minutes. The patient spent 13.8% of the night in stage N1 sleep, 75.1% in stage N2 sleep,  0.0% in stage N3 and 11.1% in REM.   During the titration period of 185.9 minutes, the patient slept for 82.5 minutes in REM and nonREM, yielding a sleep efficiency of 44.4%. Sleep onset after CPAP initiation was 39.3 minutes with a REM latency of 60.0 minutes. The patient spent 6.1% of the night in stage N1 sleep, 76.4% in stage N2 sleep, 0.0% in stage N3 and 17.6% in REM.  CARDIAC DATA The 2 lead EKG demonstrated sinus rhythm. The mean heart rate was 100.0 beats per minute. Other EKG findings include: None.  LEG MOVEMENT DATA The total Periodic Limb Movements of Sleep (PLMS) were 0. The PLMS index was 0.0 .  IMPRESSIONS - Moderate obstructive sleep apnea occurred during the diagnostic portion of the study (AHI = 25.7/hour). An optimal PAP pressure was selected for this patient ( 13 cm of water) - No significant central sleep apnea occurred during the diagnostic portion of the study (CAI = 0.9/hour). - The patient had minimal or no oxygen desaturation during the diagnostic portion of the study (Min O2 = 82.0%) - The patient snored with loud snoring volume during the diagnostic portion of the study. - No cardiac abnormalities were noted during this study. - Clinically significant periodic limb movements did not occur during sleep.  DIAGNOSIS - Obstructive Sleep Apnea (G47.33)  RECOMMENDATIONS - Trial of ResMed CPAP therapy on 13 cm H2O with a Medium size Fisher&Paykel Full Face Simplus mask and heated humidification. - Avoid alcohol, sedatives and other CNS depressants that may worsen sleep apnea and disrupt normal sleep architecture. - Sleep hygiene should be reviewed to assess factors that may improve sleep quality. - Weight management and regular exercise should be initiated or  continued. - Return to Sleep Center for re-evaluation after 6 weeks of therapy  [Electronically signed] 11/29/2021 09:16 AM  Fransico Him MD, ABSM Diplomate, American Board of Sleep Medicine

## 2021-11-29 NOTE — Telephone Encounter (Signed)
The patient has been notified of the result and verbalized understanding.  All questions (if any) were answered. Wayne Cox, CMA 11/29/2021 6:24 PM    Upon patient request DME selection is [facility]. Patient understands he will be contacted by [facility] to set up his cpap. Patient understands to call if [facility] does not contact him with new setup in a timely manner. Patient understands they will be called once confirmation has been received from [facility]/ that they have received their new machine to schedule 10 week follow up appointment.   [facility] notified of new cpap order  Please add to airview Patient was grateful for the call and thanked me.

## 2021-12-26 DIAGNOSIS — G4733 Obstructive sleep apnea (adult) (pediatric): Secondary | ICD-10-CM | POA: Diagnosis not present

## 2022-01-17 DIAGNOSIS — G4733 Obstructive sleep apnea (adult) (pediatric): Secondary | ICD-10-CM | POA: Diagnosis not present

## 2022-01-26 DIAGNOSIS — G4733 Obstructive sleep apnea (adult) (pediatric): Secondary | ICD-10-CM | POA: Diagnosis not present

## 2022-02-17 DIAGNOSIS — D125 Benign neoplasm of sigmoid colon: Secondary | ICD-10-CM | POA: Diagnosis not present

## 2022-02-17 DIAGNOSIS — K648 Other hemorrhoids: Secondary | ICD-10-CM | POA: Diagnosis not present

## 2022-02-17 DIAGNOSIS — Z1211 Encounter for screening for malignant neoplasm of colon: Secondary | ICD-10-CM | POA: Diagnosis not present

## 2022-02-17 DIAGNOSIS — D120 Benign neoplasm of cecum: Secondary | ICD-10-CM | POA: Diagnosis not present

## 2022-02-26 DIAGNOSIS — G4733 Obstructive sleep apnea (adult) (pediatric): Secondary | ICD-10-CM | POA: Diagnosis not present

## 2022-03-16 DIAGNOSIS — R7303 Prediabetes: Secondary | ICD-10-CM | POA: Diagnosis not present

## 2022-03-16 DIAGNOSIS — E7800 Pure hypercholesterolemia, unspecified: Secondary | ICD-10-CM | POA: Diagnosis not present

## 2022-03-16 DIAGNOSIS — N529 Male erectile dysfunction, unspecified: Secondary | ICD-10-CM | POA: Diagnosis not present

## 2022-03-16 DIAGNOSIS — I10 Essential (primary) hypertension: Secondary | ICD-10-CM | POA: Diagnosis not present

## 2022-03-21 ENCOUNTER — Telehealth: Payer: Federal, State, Local not specified - PPO | Admitting: Cardiology

## 2022-03-27 DIAGNOSIS — G4733 Obstructive sleep apnea (adult) (pediatric): Secondary | ICD-10-CM | POA: Diagnosis not present

## 2022-04-19 ENCOUNTER — Ambulatory Visit: Payer: Federal, State, Local not specified - PPO | Admitting: Cardiology

## 2022-04-25 ENCOUNTER — Encounter: Payer: Self-pay | Admitting: Cardiology

## 2022-04-25 ENCOUNTER — Ambulatory Visit: Payer: Federal, State, Local not specified - PPO | Attending: Cardiology | Admitting: Cardiology

## 2022-04-25 VITALS — BP 108/72 | HR 76 | Ht 76.0 in | Wt 249.6 lb

## 2022-04-25 DIAGNOSIS — I10 Essential (primary) hypertension: Secondary | ICD-10-CM | POA: Diagnosis not present

## 2022-04-25 DIAGNOSIS — G4733 Obstructive sleep apnea (adult) (pediatric): Secondary | ICD-10-CM

## 2022-04-25 NOTE — Progress Notes (Signed)
Sleep Medicine CONSULT Note    Date:  04/25/2022   ID:  Wayne Cox, DOB 07-03-1960, MRN [id number]  PCP:  Merri Brunette, MD  Cardiologist: Lance Muss, MD   Chief Complaint  Patient presents with   New Patient (Initial Visit)    Obstructive sleep apnea    History of Present Illness:  Wayne Cox is a 62 y.o. male who is being seen today for the evaluation of obstructive sleep apnea at the request of Everette Rank, MD.  This is a 62 year old male with a history of prediabetes, hypertension, hyperlipidemia who was seen by Dr. Eldridge Dace back in summer 2023 and complained of feeling tired during the day with excessive daytime sleepiness and family history of sleep apnea.  He underwent split-night sleep study showing moderate obstructive sleep apnea with an AHI of 25.7/h with no significant central events.  He underwent CPAP titration to 13 cm H2O.  He was placed on CPAP and is now referred for sleep medicine consultation to establish sleep care for treatment of his obstructive sleep apnea  He is really struggling with the CPAP device.  He has tried several different masks and is intolerant.  He cannot sleep with the masks.  The mask moves during the night and he is up all night trying to readjust the mask.  He feels like he is suffocating with it and that there is no enough air. He has tried the FFM, nasal cushion mask but cannot sleep with it.    Past Medical History:  Diagnosis Date   Abnormal EKG    Dizziness    ED (erectile dysfunction)    Hypercholesterolemia    Hypertension    Insomnia    OSA on CPAP    moderate obstructive sleep apnea with an AHI of 25.7/h with no significant central events.  He underwent CPAP titration to 13 cm H2O.   Prediabetes    Upper back pain     Past Surgical History:  Procedure Laterality Date   BACK SURGERY      Current Medications: Current Meds  Medication Sig   cyclobenzaprine (FLEXERIL) 10 MG tablet Take 10 mg by mouth 3  (three) times daily as needed for muscle spasms.   Eszopiclone 3 MG TABS Take 1 tablet by mouth daily as needed (sleep).    olmesartan-hydrochlorothiazide (BENICAR HCT) 40-25 MG tablet Take 1 tablet by mouth daily.   rosuvastatin (CRESTOR) 20 MG tablet Take 20 mg by mouth daily.   spironolactone (ALDACTONE) 50 MG tablet Take 50 mg by mouth daily.   tadalafil (CIALIS) 10 MG tablet Take 10 mg by mouth daily as needed for erectile dysfunction.    Allergies:   Patient has no known allergies.   Social History   Socioeconomic History   Marital status: Married    Spouse name: Not on file   Number of children: Not on file   Years of education: Not on file   Highest education level: Not on file  Occupational History   Not on file  Tobacco Use   Smoking status: Never   Smokeless tobacco: Not on file  Substance and Sexual Activity   Alcohol use: No   Drug use: No   Sexual activity: Not on file  Other Topics Concern   Not on file  Social History Narrative   Not on file   Social Determinants of Health   Financial Resource Strain: Not on file  Food Insecurity: Not on file  Transportation Needs:  Not on file  Physical Activity: Not on file  Stress: Not on file  Social Connections: Not on file   He  Family History:  The patient's family history includes Cancer in his brother, father, and sister; Diabetes in his brother; Hypertension in his brother; Kidney disease in his brother.   ROS:   Please see the history of present illness.    ROS All other systems reviewed and are negative.      No data to display             PHYSICAL EXAM:   VS:  BP 108/72   Pulse 76   Ht 6\' 4"  (1.93 m)   Wt 249 lb 9.6 oz (113.2 kg)   SpO2 96%   BMI 30.38 kg/m    GEN: Well nourished, well developed, in no acute distress  HEENT: normal  Neck: no JVD, carotid bruits, or masses Cardiac: RRR; no murmurs, rubs, or gallops,no edema.  Intact distal pulses bilaterally.  Respiratory:  clear to  auscultation bilaterally, normal work of breathing GI: soft, nontender, nondistended, + BS MS: no deformity or atrophy  Skin: warm and dry, no rash Neuro:  Alert and Oriented x 3, Strength and sensation are intact Psych: euthymic mood, full affect  Wt Readings from Last 3 Encounters:  04/25/22 249 lb 9.6 oz (113.2 kg)  11/28/21 240 lb (108.9 kg)  09/14/21 247 lb (112 kg)      Studies/Labs Reviewed:   Split night sleep study and PAP compliance download  Recent Labs: No results found for requested labs within last 365 days.    Additional studies/ records that were reviewed today include:  none    ASSESSMENT:    1. OSA (obstructive sleep apnea)   2. Benign essential HTN      PLAN:  In order of problems listed above:  OSA - The patient is tolerating PAP therapy well without any problems. The PAP download performed by his DME was personally reviewed and interpreted by me today and showed an AHI of 2.5 /hr on 13 cm H2O with 7% compliance in using more than 4 hours nightly.  The patient has been using and benefiting from PAP use and will continue to benefit from therapy.  -He has all only used his device 3 days out of the past 30 days. -he is really struggling with the device and is up every hour trying to adjust his device -I think he would be a good candidate for the Inspire device and will refer to [facility]NT for evaluation  2.  HTN -BP controlled on exam today -continue prescription drug management with Benicar HCT 40-25mg  daily and spiro 50mg  daily with PRN refills  Time Spent: 20 minutes total time of encounter, including 15 minutes spent in face-to-face patient care on the date of this encounter. This time includes coordination of care and counseling regarding above mentioned problem list. Remainder of non-face-to-face time involved reviewing chart documents/testing relevant to the patient encounter and documentation in the medical record. I have independently reviewed  documentation from referring provider  Medication Adjustments/Labs and Tests Ordered: Current medicines are reviewed at length with the patient today.  Concerns regarding medicines are outlined above.  Medication changes, Labs and Tests ordered today are listed in the Patient Instructions below.  There are no Patient Instructions on file for this visit.   Signed, Armanda Magic, MD  04/25/2022 9:32 AM    [facility] [address] [place], [place], [place]  [address] Phone: [phone]; Fax: [phone]

## 2022-04-25 NOTE — Patient Instructions (Addendum)
Medication Instructions:  Your physician recommends that you continue on your current medications as directed. Please refer to the Current Medication list given to you today.  *If you need a refill on your cardiac medications before your next appointment, please call your pharmacy*   Lab Work: None.  If you have labs (blood work) drawn today and your tests are completely normal, you will receive your results only by: MyChart Message (if you have MyChart) OR A paper copy in the mail If you have any lab test that is abnormal or we need to change your treatment, we will call you to review the results.   Testing/Procedures: None.   Follow-Up: At [facility], you and your health needs are our priority.  As part of our continuing mission to provide you with exceptional heart care, we have created designated Provider Care Teams.  These Care Teams include your primary Cardiologist (physician) and Advanced Practice Providers (APPs -  Physician Assistants and Nurse Practitioners) who all work together to provide you with the care you need, when you need it.  We recommend signing up for the patient portal called "MyChart".  Sign up information is provided on this After Visit Summary.  MyChart is used to connect with patients for Virtual Visits (Telemedicine).  Patients are able to view lab/test results, encounter notes, upcoming appointments, etc.  Non-urgent messages can be sent to your provider as well.   To learn more about what you can do with MyChart, go to [email or web address].    Your next appointment will be dependent upon your appointment with Dr. Christia Reading and his recommendations and it will be with:  Provider:   Dr. Armanda Magic, MD   Other Instructions You have bee referred to Dr. Christia Reading, ENT for further evaluation of your sleep apnea. His office will call you to scheduled an appointment.   [address], [place], [place] [address] Phone: [phone]

## 2022-04-27 DIAGNOSIS — G4733 Obstructive sleep apnea (adult) (pediatric): Secondary | ICD-10-CM | POA: Diagnosis not present

## 2022-05-27 DIAGNOSIS — G4733 Obstructive sleep apnea (adult) (pediatric): Secondary | ICD-10-CM | POA: Diagnosis not present

## 2022-06-27 DIAGNOSIS — G4733 Obstructive sleep apnea (adult) (pediatric): Secondary | ICD-10-CM | POA: Diagnosis not present

## 2022-07-03 ENCOUNTER — Inpatient Hospital Stay (HOSPITAL_COMMUNITY)
Admission: EM | Admit: 2022-07-03 | Discharge: 2022-07-08 | DRG: 175 | Disposition: A | Discharge status: Home / Self Care | Payer: Federal, State, Local not specified - PPO | Attending: Internal Medicine | Admitting: Internal Medicine

## 2022-07-03 ENCOUNTER — Emergency Department (HOSPITAL_COMMUNITY): Payer: Federal, State, Local not specified - PPO

## 2022-07-03 ENCOUNTER — Encounter (HOSPITAL_COMMUNITY): Payer: Self-pay

## 2022-07-03 ENCOUNTER — Other Ambulatory Visit: Payer: Self-pay

## 2022-07-03 DIAGNOSIS — E785 Hyperlipidemia, unspecified: Secondary | ICD-10-CM | POA: Diagnosis present

## 2022-07-03 DIAGNOSIS — I10 Essential (primary) hypertension: Secondary | ICD-10-CM | POA: Diagnosis not present

## 2022-07-03 DIAGNOSIS — I2694 Multiple subsegmental pulmonary emboli without acute cor pulmonale: Principal | ICD-10-CM | POA: Diagnosis present

## 2022-07-03 DIAGNOSIS — E7800 Pure hypercholesterolemia, unspecified: Secondary | ICD-10-CM | POA: Diagnosis present

## 2022-07-03 DIAGNOSIS — Z79899 Other long term (current) drug therapy: Secondary | ICD-10-CM

## 2022-07-03 DIAGNOSIS — R071 Chest pain on breathing: Secondary | ICD-10-CM

## 2022-07-03 DIAGNOSIS — K760 Fatty (change of) liver, not elsewhere classified: Secondary | ICD-10-CM | POA: Diagnosis not present

## 2022-07-03 DIAGNOSIS — N179 Acute kidney failure, unspecified: Secondary | ICD-10-CM | POA: Diagnosis not present

## 2022-07-03 DIAGNOSIS — J189 Pneumonia, unspecified organism: Secondary | ICD-10-CM | POA: Diagnosis present

## 2022-07-03 DIAGNOSIS — G4700 Insomnia, unspecified: Secondary | ICD-10-CM | POA: Diagnosis present

## 2022-07-03 DIAGNOSIS — Z8249 Family history of ischemic heart disease and other diseases of the circulatory system: Secondary | ICD-10-CM | POA: Diagnosis not present

## 2022-07-03 DIAGNOSIS — J984 Other disorders of lung: Secondary | ICD-10-CM

## 2022-07-03 DIAGNOSIS — Z833 Family history of diabetes mellitus: Secondary | ICD-10-CM | POA: Diagnosis not present

## 2022-07-03 DIAGNOSIS — Z841 Family history of disorders of kidney and ureter: Secondary | ICD-10-CM | POA: Diagnosis not present

## 2022-07-03 DIAGNOSIS — I2699 Other pulmonary embolism without acute cor pulmonale: Secondary | ICD-10-CM | POA: Diagnosis present

## 2022-07-03 DIAGNOSIS — R7303 Prediabetes: Secondary | ICD-10-CM | POA: Diagnosis present

## 2022-07-03 DIAGNOSIS — G4733 Obstructive sleep apnea (adult) (pediatric): Secondary | ICD-10-CM | POA: Diagnosis not present

## 2022-07-03 DIAGNOSIS — J9811 Atelectasis: Secondary | ICD-10-CM | POA: Diagnosis not present

## 2022-07-03 DIAGNOSIS — J90 Pleural effusion, not elsewhere classified: Secondary | ICD-10-CM | POA: Diagnosis not present

## 2022-07-03 DIAGNOSIS — R1011 Right upper quadrant pain: Secondary | ICD-10-CM | POA: Diagnosis not present

## 2022-07-03 DIAGNOSIS — R079 Chest pain, unspecified: Secondary | ICD-10-CM | POA: Diagnosis not present

## 2022-07-03 DIAGNOSIS — R0602 Shortness of breath: Secondary | ICD-10-CM | POA: Diagnosis not present

## 2022-07-03 LAB — CBC
HCT: 44.3 % (ref 39.0–52.0)
Hemoglobin: 14.2 g/dL (ref 13.0–17.0)
MCH: 31.3 pg (ref 26.0–34.0)
MCHC: 32.1 g/dL (ref 30.0–36.0)
MCV: 97.6 fL (ref 80.0–100.0)
Platelets: 176 10*3/uL (ref 150–400)
RBC: 4.54 MIL/uL (ref 4.22–5.81)
RDW: 12.7 % (ref 11.5–15.5)
WBC: 9.5 10*3/uL (ref 4.0–10.5)
nRBC: 0 % (ref 0.0–0.2)

## 2022-07-03 LAB — COMPREHENSIVE METABOLIC PANEL
ALT: 25 U/L (ref 0–44)
AST: 26 U/L (ref 15–41)
Albumin: 4.1 g/dL (ref 3.5–5.0)
Alkaline Phosphatase: 50 U/L (ref 38–126)
Anion gap: 10 (ref 5–15)
BUN: 15 mg/dL (ref 8–23)
CO2: 23 mmol/L (ref 22–32)
Calcium: 8.5 mg/dL — ABNORMAL LOW (ref 8.9–10.3)
Chloride: 102 mmol/L (ref 98–111)
Creatinine, Ser: 1.2 mg/dL (ref 0.61–1.24)
GFR, Estimated: >60 mL/min (ref >60)
Glucose, Bld: 111 mg/dL — ABNORMAL HIGH (ref 70–99)
Potassium: 4.2 mmol/L (ref 3.5–5.1)
Sodium: 135 mmol/L (ref 135–145)
Total Bilirubin: 1.5 mg/dL — ABNORMAL HIGH (ref 0.3–1.2)
Total Protein: 7.6 g/dL (ref 6.5–8.1)

## 2022-07-03 LAB — TROPONIN I (HIGH SENSITIVITY): Troponin I (High Sensitivity): 3 ng/L (ref <18)

## 2022-07-03 LAB — BRAIN NATRIURETIC PEPTIDE: B Natriuretic Peptide: 51.6 pg/mL (ref 0.0–100.0)

## 2022-07-03 LAB — LIPASE, BLOOD: Lipase: 32 U/L (ref 11–51)

## 2022-07-03 LAB — D-DIMER, QUANTITATIVE: D-Dimer, Quant: 0.61 ug/mL-FEU — ABNORMAL HIGH (ref 0.00–0.50)

## 2022-07-03 MED ORDER — KETOROLAC TROMETHAMINE 15 MG/ML IJ SOLN
15.0000 mg | Freq: Once | INTRAMUSCULAR | Status: AC
  Administered 2022-07-03: 15 mg via INTRAVENOUS
  Filled 2022-07-03: qty 1

## 2022-07-03 MED ORDER — DOXYCYCLINE HYCLATE 100 MG PO TABS
100.0000 mg | ORAL_TABLET | Freq: Once | ORAL | Status: AC
  Administered 2022-07-03: 100 mg via ORAL
  Filled 2022-07-03: qty 1

## 2022-07-03 MED ORDER — MORPHINE SULFATE (PF) 4 MG/ML IV SOLN
4.0000 mg | Freq: Once | INTRAVENOUS | Status: AC
  Administered 2022-07-03: 4 mg via INTRAVENOUS
  Filled 2022-07-03: qty 1

## 2022-07-03 NOTE — ED Provider Notes (Signed)
[place] EMERGENCY DEPARTMENT AT [facility] Provider Note   CSN: [id number] Arrival date & time: 07/03/22  1904     History {Add pertinent medical, surgical, social history, OB history to HPI:1} Chief Complaint  Patient presents with   Abdominal Pain   Shoulder Pain    ARUSH MICA is a 62 y.o. male.  62 year old male with history of hypertension, hyperlipidemia, and prediabetes who presents emergency department with right-sided abdominal pain.  Reports that since Sunday has had intermittent right-sided abdominal pain.  Says it is worse with taking a deep breath.  Has not tried to eat since.  Says it is severe and took some ibuprofen which appears to help.  No fevers.  No shortness of breath, nausea or vomiting, diarrhea.  Has had a mild cough.  No hematuria.  No history of kidney stones, DVT/PE, cancer, gallstones or abdominal surgeries.       Home Medications Prior to Admission medications   Medication Sig Start Date End Date Taking? Authorizing Provider  cyclobenzaprine (FLEXERIL) 10 MG tablet Take 10 mg by mouth 3 (three) times daily as needed for muscle spasms.    [provider]  Eszopiclone 3 MG TABS Take 1 tablet by mouth daily as needed (sleep).  10/29/14   [provider]  olmesartan-hydrochlorothiazide (BENICAR HCT) 40-25 MG tablet Take 1 tablet by mouth daily. 08/17/21   [provider]  rosuvastatin (CRESTOR) 20 MG tablet Take 20 mg by mouth daily. 12/25/21   [provider]  spironolactone (ALDACTONE) 50 MG tablet Take 50 mg by mouth daily.    [provider]  tadalafil (CIALIS) 10 MG tablet Take 10 mg by mouth daily as needed for erectile dysfunction.    [provider]      Allergies    Patient has no known allergies.    Review of Systems   Review of Systems  Physical Exam Updated Vital Signs BP (!) 153/80 (BP Location: Right Arm)   Pulse 92   Temp 99.5 F (37.5 C) (Oral)   Resp 18   Ht  6\' 4"  (1.93 m)   Wt 111.1 kg   SpO2 98%   BMI 29.82 kg/m  Physical Exam Vitals and nursing note reviewed.  Constitutional:      General: He is not in acute distress.    Appearance: He is well-developed.     Comments: Very uncomfortable appearing  HENT:     Head: Normocephalic and atraumatic.     Right Ear: External ear normal.     Left Ear: External ear normal.     Nose: Nose normal.  Eyes:     Extraocular Movements: Extraocular movements intact.     Conjunctiva/sclera: Conjunctivae normal.     Pupils: Pupils are equal, round, and reactive to light.  Cardiovascular:     Rate and Rhythm: Normal rate and regular rhythm.     Heart sounds: Normal heart sounds.  Pulmonary:     Effort: Pulmonary effort is normal. No respiratory distress.     Breath sounds: Normal breath sounds.  Abdominal:     General: There is no distension.     Palpations: Abdomen is soft. There is no mass.     Tenderness: There is abdominal tenderness (Right upper quadrant.). There is no guarding.  Musculoskeletal:     Cervical back: Normal range of motion and neck supple.     Right lower leg: No edema.     Left lower leg: No edema.  Skin:  General: Skin is warm and dry.  Neurological:     Mental Status: He is alert. Mental status is at baseline.  Psychiatric:        Mood and Affect: Mood normal.        Behavior: Behavior normal.     ED Results / Procedures / Treatments   Labs (all labs ordered are listed, but only abnormal results are displayed) Labs Reviewed  COMPREHENSIVE METABOLIC PANEL - Abnormal; Notable for the following components:      Result Value   Glucose, Bld 111 (*)    Calcium 8.5 (*)    Total Bilirubin 1.5 (*)    All other components within normal limits  LIPASE, BLOOD  CBC  URINALYSIS, ROUTINE W REFLEX MICROSCOPIC  D-DIMER, QUANTITATIVE  BRAIN NATRIURETIC PEPTIDE  TROPONIN I (HIGH SENSITIVITY)    EKG EKG Interpretation  Date/Time:  Monday July 03 2022 19:37:57  EDT Ventricular Rate:  80 PR Interval:  177 QRS Duration: 86 QT Interval:  347 QTC Calculation: 401 R Axis:   63 Text Interpretation: Sinus rhythm Borderline T wave abnormalities Confirmed by Vonita Moss [phone]) on 07/03/2022 9:06:59 PM  Radiology No results found.  Procedures Procedures  {Document cardiac monitor, telemetry assessment procedure when appropriate:1}  Medications Ordered in ED Medications  ketorolac (TORADOL) 15 MG/ML injection 15 mg (has no administration in time range)    ED Course/ Medical Decision Making/ A&P   {   Click here for ABCD2, HEART and other calculatorsREFRESH Note before signing :1}                          Medical Decision Making Amount and/or Complexity of Data Reviewed Labs: ordered. Radiology: ordered.  Risk Prescription drug management.   ***  {Document critical care time when appropriate:1} {Document review of labs and clinical decision tools ie heart score, Chads2Vasc2 etc:1}  {Document your independent review of radiology images, and any outside records:1} {Document your discussion with family members, caretakers, and with consultants:1} {Document social determinants of health affecting pt's care:1} {Document your decision making why or why not admission, treatments were needed:1} Final Clinical Impression(s) / ED Diagnoses Final diagnoses:  None    Rx / DC Orders ED Discharge Orders     None

## 2022-07-03 NOTE — ED Notes (Signed)
Unsuccessful PIV attempt in L hand.

## 2022-07-03 NOTE — ED Triage Notes (Signed)
Patient reports abd pain starting last night in RUQ radiating to back, states he has been burping often and it is painful as well. Endorses pain to right shoulder as well, states painful to raise arm up but feels okay when arm is by side. Also reports intermittent headache since last night. Took ibuprofen, states it has seemed to help a little. Pain 7/10 currently.

## 2022-07-04 ENCOUNTER — Emergency Department (HOSPITAL_COMMUNITY): Payer: Federal, State, Local not specified - PPO

## 2022-07-04 ENCOUNTER — Other Ambulatory Visit (HOSPITAL_COMMUNITY): Payer: Self-pay

## 2022-07-04 ENCOUNTER — Encounter (HOSPITAL_COMMUNITY): Payer: Self-pay | Admitting: Family Medicine

## 2022-07-04 ENCOUNTER — Inpatient Hospital Stay (HOSPITAL_COMMUNITY): Payer: Federal, State, Local not specified - PPO

## 2022-07-04 DIAGNOSIS — R7303 Prediabetes: Secondary | ICD-10-CM | POA: Diagnosis present

## 2022-07-04 DIAGNOSIS — G4733 Obstructive sleep apnea (adult) (pediatric): Secondary | ICD-10-CM

## 2022-07-04 DIAGNOSIS — Z79899 Other long term (current) drug therapy: Secondary | ICD-10-CM | POA: Diagnosis not present

## 2022-07-04 DIAGNOSIS — N179 Acute kidney failure, unspecified: Secondary | ICD-10-CM | POA: Diagnosis not present

## 2022-07-04 DIAGNOSIS — Z833 Family history of diabetes mellitus: Secondary | ICD-10-CM | POA: Diagnosis not present

## 2022-07-04 DIAGNOSIS — I2699 Other pulmonary embolism without acute cor pulmonale: Secondary | ICD-10-CM

## 2022-07-04 DIAGNOSIS — R079 Chest pain, unspecified: Secondary | ICD-10-CM | POA: Diagnosis not present

## 2022-07-04 DIAGNOSIS — I10 Essential (primary) hypertension: Secondary | ICD-10-CM | POA: Diagnosis not present

## 2022-07-04 DIAGNOSIS — R0602 Shortness of breath: Secondary | ICD-10-CM

## 2022-07-04 DIAGNOSIS — J984 Other disorders of lung: Secondary | ICD-10-CM

## 2022-07-04 DIAGNOSIS — E7800 Pure hypercholesterolemia, unspecified: Secondary | ICD-10-CM | POA: Diagnosis present

## 2022-07-04 DIAGNOSIS — E785 Hyperlipidemia, unspecified: Secondary | ICD-10-CM | POA: Diagnosis present

## 2022-07-04 DIAGNOSIS — Z841 Family history of disorders of kidney and ureter: Secondary | ICD-10-CM | POA: Diagnosis not present

## 2022-07-04 DIAGNOSIS — J90 Pleural effusion, not elsewhere classified: Secondary | ICD-10-CM | POA: Diagnosis not present

## 2022-07-04 DIAGNOSIS — G4700 Insomnia, unspecified: Secondary | ICD-10-CM | POA: Diagnosis present

## 2022-07-04 DIAGNOSIS — J189 Pneumonia, unspecified organism: Secondary | ICD-10-CM | POA: Diagnosis present

## 2022-07-04 DIAGNOSIS — Z8249 Family history of ischemic heart disease and other diseases of the circulatory system: Secondary | ICD-10-CM | POA: Diagnosis not present

## 2022-07-04 DIAGNOSIS — I2694 Multiple subsegmental pulmonary emboli without acute cor pulmonale: Secondary | ICD-10-CM | POA: Diagnosis present

## 2022-07-04 DIAGNOSIS — J9811 Atelectasis: Secondary | ICD-10-CM | POA: Diagnosis not present

## 2022-07-04 LAB — ECHOCARDIOGRAM COMPLETE
AR max vel: 3.09 cm2
AV Area VTI: 3.27 cm2
AV Area mean vel: 2.98 cm2
AV Mean grad: 5 mmHg
AV Peak grad: 8.6 mmHg
Ao pk vel: 1.47 m/s
Area-P 1/2: 6.71 cm2
Calc EF: 69.2 %
Height: 76 in
MV VTI: 4.06 cm2
S' Lateral: 2.9 cm
Single Plane A2C EF: 69.5 %
Single Plane A4C EF: 68.7 %
Weight: 3920 oz

## 2022-07-04 LAB — CBC
HCT: 41.9 % (ref 39.0–52.0)
Hemoglobin: 13.9 g/dL (ref 13.0–17.0)
MCH: 31 pg (ref 26.0–34.0)
MCHC: 33.2 g/dL (ref 30.0–36.0)
MCV: 93.3 fL (ref 80.0–100.0)
Platelets: 203 10*3/uL (ref 150–400)
RBC: 4.49 MIL/uL (ref 4.22–5.81)
RDW: 12.8 % (ref 11.5–15.5)
WBC: 11.6 10*3/uL — ABNORMAL HIGH (ref 4.0–10.5)
nRBC: 0 % (ref 0.0–0.2)

## 2022-07-04 LAB — URINALYSIS, ROUTINE W REFLEX MICROSCOPIC
Bacteria, UA: NONE SEEN
Bilirubin Urine: NEGATIVE
Glucose, UA: NEGATIVE mg/dL
Ketones, ur: NEGATIVE mg/dL
Leukocytes,Ua: NEGATIVE
Nitrite: NEGATIVE
Protein, ur: NEGATIVE mg/dL
Specific Gravity, Urine: 1.021 (ref 1.005–1.030)
pH: 5 (ref 5.0–8.0)

## 2022-07-04 LAB — HEPATIC FUNCTION PANEL
ALT: 25 U/L (ref 0–44)
AST: 19 U/L (ref 15–41)
Albumin: 4.1 g/dL (ref 3.5–5.0)
Alkaline Phosphatase: 49 U/L (ref 38–126)
Bilirubin, Direct: 0.2 mg/dL (ref 0.0–0.2)
Indirect Bilirubin: 0.9 mg/dL (ref 0.3–0.9)
Total Bilirubin: 1.1 mg/dL (ref 0.3–1.2)
Total Protein: 7.7 g/dL (ref 6.5–8.1)

## 2022-07-04 LAB — BASIC METABOLIC PANEL
Anion gap: 7 (ref 5–15)
BUN: 15 mg/dL (ref 8–23)
CO2: 27 mmol/L (ref 22–32)
Calcium: 8.7 mg/dL — ABNORMAL LOW (ref 8.9–10.3)
Chloride: 102 mmol/L (ref 98–111)
Creatinine, Ser: 1.33 mg/dL — ABNORMAL HIGH (ref 0.61–1.24)
GFR, Estimated: >60 mL/min (ref >60)
Glucose, Bld: 141 mg/dL — ABNORMAL HIGH (ref 70–99)
Potassium: 3.7 mmol/L (ref 3.5–5.1)
Sodium: 136 mmol/L (ref 135–145)

## 2022-07-04 LAB — HEPARIN LEVEL (UNFRACTIONATED)
Heparin Unfractionated: 0.64 IU/mL (ref 0.30–0.70)
Heparin Unfractionated: 0.44 IU/mL (ref 0.30–0.70)

## 2022-07-04 LAB — HIV ANTIBODY (ROUTINE TESTING W REFLEX): HIV Screen 4th Generation wRfx: NONREACTIVE

## 2022-07-04 MED ORDER — SODIUM CHLORIDE 0.9% FLUSH
3.0000 mL | Freq: Two times a day (BID) | INTRAVENOUS | Status: DC
  Administered 2022-07-04 – 2022-07-08 (×6): 3 mL via INTRAVENOUS

## 2022-07-04 MED ORDER — ONDANSETRON HCL 4 MG/2ML IJ SOLN: 4.0000 mg | Freq: Four times a day (QID) | INTRAMUSCULAR | Status: DC | PRN

## 2022-07-04 MED ORDER — HYDROMORPHONE HCL 1 MG/ML IJ SOLN
0.5000 mg | INTRAMUSCULAR | Status: DC | PRN
  Administered 2022-07-04 – 2022-07-06 (×9): 1 mg via INTRAVENOUS
  Filled 2022-07-04 (×9): qty 1

## 2022-07-04 MED ORDER — HEPARIN (PORCINE) 25000 UT/250ML-% IV SOLN
2100.0000 [IU]/h | INTRAVENOUS | Status: AC
  Administered 2022-07-04 – 2022-07-06 (×3): 1800 [IU]/h via INTRAVENOUS
  Filled 2022-07-04 (×4): qty 250

## 2022-07-04 MED ORDER — IRBESARTAN 150 MG PO TABS
300.0000 mg | ORAL_TABLET | Freq: Every day | ORAL | Status: DC
  Administered 2022-07-04: 300 mg via ORAL
  Filled 2022-07-04: qty 2

## 2022-07-04 MED ORDER — ACETAMINOPHEN 650 MG RE SUPP: 650.0000 mg | Freq: Four times a day (QID) | RECTAL | Status: DC | PRN

## 2022-07-04 MED ORDER — IOHEXOL 350 MG/ML SOLN
80.0000 mL | Freq: Once | INTRAVENOUS | Status: AC | PRN
  Administered 2022-07-04: 80 mL via INTRAVENOUS

## 2022-07-04 MED ORDER — SPIRONOLACTONE 25 MG PO TABS
50.0000 mg | ORAL_TABLET | Freq: Every day | ORAL | Status: DC
  Administered 2022-07-04: 50 mg via ORAL
  Filled 2022-07-04: qty 2

## 2022-07-04 MED ORDER — HYDROMORPHONE HCL 1 MG/ML IJ SOLN
0.5000 mg | INTRAMUSCULAR | Status: DC | PRN
  Filled 2022-07-04: qty 1

## 2022-07-04 MED ORDER — OXYCODONE HCL 5 MG PO TABS
5.0000 mg | ORAL_TABLET | ORAL | Status: DC | PRN
  Administered 2022-07-04 – 2022-07-05 (×4): 5 mg via ORAL
  Filled 2022-07-04 (×4): qty 1

## 2022-07-04 MED ORDER — HYDROCHLOROTHIAZIDE 25 MG PO TABS
25.0000 mg | ORAL_TABLET | Freq: Every day | ORAL | Status: DC
  Administered 2022-07-04: 25 mg via ORAL
  Filled 2022-07-04: qty 1

## 2022-07-04 MED ORDER — ZOLPIDEM TARTRATE 5 MG PO TABS
5.0000 mg | ORAL_TABLET | Freq: Every evening | ORAL | Status: DC | PRN
  Administered 2022-07-07: 5 mg via ORAL
  Filled 2022-07-04: qty 1

## 2022-07-04 MED ORDER — OLMESARTAN MEDOXOMIL-HCTZ 40-25 MG PO TABS: 1.0000 | ORAL_TABLET | Freq: Every day | ORAL | Status: DC

## 2022-07-04 MED ORDER — ACETAMINOPHEN 325 MG PO TABS
650.0000 mg | ORAL_TABLET | Freq: Four times a day (QID) | ORAL | Status: DC | PRN
  Administered 2022-07-05 – 2022-07-07 (×6): 650 mg via ORAL
  Filled 2022-07-04 (×6): qty 2

## 2022-07-04 MED ORDER — HYDROMORPHONE HCL 1 MG/ML IJ SOLN: 0.5000 mg | INTRAMUSCULAR | Status: DC | PRN

## 2022-07-04 MED ORDER — POLYETHYLENE GLYCOL 3350 17 G PO PACK
17.0000 g | PACK | Freq: Every day | ORAL | Status: DC | PRN
  Administered 2022-07-05: 17 g via ORAL
  Filled 2022-07-04: qty 1

## 2022-07-04 MED ORDER — ONDANSETRON HCL 4 MG PO TABS: 4.0000 mg | ORAL_TABLET | Freq: Four times a day (QID) | ORAL | Status: DC | PRN

## 2022-07-04 MED ORDER — ROSUVASTATIN CALCIUM 10 MG PO TABS
20.0000 mg | ORAL_TABLET | Freq: Every day | ORAL | Status: DC
  Administered 2022-07-04 – 2022-07-08 (×5): 20 mg via ORAL
  Filled 2022-07-04 (×5): qty 2

## 2022-07-04 MED ORDER — HEPARIN BOLUS VIA INFUSION
3000.0000 [IU] | Freq: Once | INTRAVENOUS | Status: AC
  Administered 2022-07-04: 3000 [IU] via INTRAVENOUS
  Filled 2022-07-04: qty 3000

## 2022-07-04 NOTE — TOC Benefit Eligibility Note (Addendum)
Pharmacy Patient Advocate Encounter  Insurance verification completed.    The patient is insured through [employer]    Ran test claim for Eliquis and the current 30 day co-pay is $60.00.  Ran test claim for Xarelto and the current 30 day co-pay is $60.00.   This test claim was processed through [facility]- copay amounts may vary at other pharmacies due to pharmacy/plan contracts, or as the patient moves through the different stages of their insurance plan.

## 2022-07-04 NOTE — Progress Notes (Signed)
Mobility Specialist - Progress Note   07/04/22 1058  Mobility  Activity Ambulated with assistance in hallway  Level of Assistance Modified independent, requires aide device or extra time  Assistive Device None  Distance Ambulated (ft) 500 ft  Range of Motion/Exercises Active  Activity Response Tolerated well  Mobility Referral Yes  $Mobility charge 1 Mobility  Mobility Specialist Start Time (ACUTE ONLY) 1050  Mobility Specialist Stop Time (ACUTE ONLY) 1058  Mobility Specialist Time Calculation (min) (ACUTE ONLY) 8 min   Pt received in chair and agreed to mobility, had one instance of LOB when first standing to put on shoes, pt had some pain in abdominal area. Returned to chair with all needs met.  Marilynne Halsted Mobility Specialist

## 2022-07-04 NOTE — TOC CM/SW Note (Signed)
Transition of Care [facility]) - Inpatient Brief Assessment   Patient Details  Name: Wayne Cox MRN: [id number] Date of Birth: 03/23/1960  [facility] of [facility] CM/SW Contact:    Otelia Santee, LCSW Phone Number: 07/04/2022, 2:06 PM   Clinical Narrative: Chart reviewed. No TOC needs identified.    Transition of Care Asessment: Insurance and Status: Insurance coverage has been reviewed Patient has primary care physician: Yes Home environment has been reviewed: Home w/ spouse Prior level of function:: Independent Prior/Current Home Services: No current home services Social Determinants of Health Reivew: SDOH reviewed no interventions necessary Readmission risk has been reviewed: Yes Transition of care needs: no transition of care needs at this time

## 2022-07-04 NOTE — Progress Notes (Signed)
ANTICOAGULATION CONSULT NOTE - Initial Consult  Pharmacy Consult for Heparin Indication: pulmonary embolus  No Known Allergies  Patient Measurements: Height: 6\' 4"  (193 cm) Weight: 111.1 kg (245 lb) IBW/kg (Calculated) : 86.8 Heparin Dosing Weight: 110 kg   Vital Signs: Temp: 98.9 F (37.2 C) (06/17 2340) Temp Source: Oral (06/17 2340) BP: 149/82 (06/17 2230) Pulse Rate: 80 (06/17 2230)  Labs: Recent Labs    07/03/22 1959 07/03/22 2255  HGB 14.2  --   HCT 44.3  --   PLT 176  --   CREATININE 1.20  --   TROPONINIHS  --  3    Estimated Creatinine Clearance: 86 mL/min (by C-G formula based on SCr of 1.2 mg/dL).   Medical History: Past Medical History:  Diagnosis Date   Abnormal EKG    Dizziness    ED (erectile dysfunction)    Hypercholesterolemia    Hypertension    Insomnia    OSA on CPAP    moderate obstructive sleep apnea with an AHI of 25.7/h with no significant central events.  He underwent CPAP titration to 13 cm H2O.   Prediabetes    Upper back pain     Medications:  Infusions:   Assessment: 62 yo M with new PE.  No evidence of right heart strain.  Pharmacy consulted to dose IV heparin.  He is not on anticoagulation PTA. CBC WNL at baseline.   Goal of Therapy:  Heparin level 0.3-0.7 units/ml Monitor platelets by anticoagulation protocol: Yes   Plan:  Give 3000 units bolus x 1 Start heparin infusion at 1800 units/hr Check anti-Xa level in 8 hours and daily while on heparin Continue to monitor H&H and platelets  Junita Push PharmD 07/04/2022,12:48 AM

## 2022-07-04 NOTE — Progress Notes (Signed)
  PROGRESS NOTE  Patient admitted earlier this morning. See H&P.   Wayne Cox is a 62 year old male with past medical history significant for hypertension, hyperlipidemia, OSA who presents with right upper quadrant abdominal and chest pain radiating to his back.  In the emergency department, he was found to have acute right-sided PE with right middle lobe infarction.  He was started on IV heparin.  Patient doing well on room air.  Continues to have some right-sided abdominal pain that goes to his chest and his back.  DVT ultrasound negative Echocardiogram is pending Continue IV heparin, plan to transition to DOAC prior to discharge  Status is: Inpatient Remains inpatient appropriate because: IV heparin   Noralee Stain, DO [facility] 07/04/2022, 12:30 PM  Available via Epic secure chat 7am-7pm After these hours, please refer to coverage provider listed on [email or web address]

## 2022-07-04 NOTE — ED Notes (Signed)
ED TO INPATIENT HANDOFF REPORT  ED Nurse Name and Phone #: Linus Orn Name/Age/Gender Wayne Cox 62 y.o. male Room/Bed: WA11/WA11  Code Status   Code Status: Full Code  Home/SNF/Other Home Patient oriented to: self, place, time, and situation Is this baseline? Yes   Triage Complete: Triage complete  Chief Complaint Pulmonary embolism [facility]) [I26.99]  Triage Note Patient reports abd pain starting last night in RUQ radiating to back, states he has been burping often and it is painful as well. Endorses pain to right shoulder as well, states painful to raise arm up but feels okay when arm is by side. Also reports intermittent headache since last night. Took ibuprofen, states it has seemed to help a little. Pain 7/10 currently.    Allergies No Known Allergies  Level of Care/Admitting Diagnosis ED Disposition     ED Disposition  Admit   Condition  --   Comment  Hospital Area: [facility] [[id number]]  Level of Care: Telemetry [5]  Admit to tele based on following criteria: Monitor for Ischemic changes  May admit patient to [facility] or [facility] if equivalent level of care is available:: Yes  Covid Evaluation: Asymptomatic - no recent exposure (last 10 days) testing not required  Diagnosis: Pulmonary embolism [facility]) [[id number]]  Admitting Physician: Briscoe Deutscher [[id number]]  Attending Physician: Briscoe Deutscher [[id number]]  Certification:: I certify this patient will need inpatient services for at least 2 midnights  Estimated Length of Stay: 3          B Medical/Surgery History Past Medical History:  Diagnosis Date   Abnormal EKG    Dizziness    ED (erectile dysfunction)    Hypercholesterolemia    Hypertension    Insomnia    OSA on CPAP    moderate obstructive sleep apnea with an AHI of 25.7/h with no significant central events.  He underwent CPAP titration to 13 cm H2O.   Prediabetes    Upper back pain    Past Surgical History:   Procedure Laterality Date   BACK SURGERY       A IV Location/Drains/Wounds Patient Lines/Drains/Airways Status     Active Line/Drains/Airways     Name Placement date Placement time Site Days   Peripheral IV 07/03/22 20 G Right Antecubital 07/03/22  2230  Antecubital  1            Intake/Output Last 24 hours No intake or output data in the 24 hours ending 07/04/22 0132  Labs/Imaging Results for orders placed or performed during the hospital encounter of 07/03/22 (from the past 48 hour(s))  Lipase, blood     Status: None   Collection Time: 07/03/22  7:59 PM  Result Value Ref Range   Lipase 32 11 - 51 U/L    Comment: Performed at [facility], [address] [place], [place] [address]  Comprehensive metabolic panel     Status: Abnormal   Collection Time: 07/03/22  7:59 PM  Result Value Ref Range   Sodium 135 135 - 145 mmol/L   Potassium 4.2 3.5 - 5.1 mmol/L    Comment: HEMOLYSIS AT THIS LEVEL MAY AFFECT RESULT   Chloride 102 98 - 111 mmol/L   CO2 23 22 - 32 mmol/L   Glucose, Bld 111 (H) 70 - 99 mg/dL    Comment: Glucose reference range applies only to samples taken after fasting for at least 8 hours.   BUN 15 8 - 23 mg/dL  Creatinine, Ser 1.20 0.61 - 1.24 mg/dL   Calcium 8.5 (L) 8.9 - 10.3 mg/dL   Total Protein 7.6 6.5 - 8.1 g/dL   Albumin 4.1 3.5 - 5.0 g/dL   AST 26 15 - 41 U/L    Comment: HEMOLYSIS AT THIS LEVEL MAY AFFECT RESULT   ALT 25 0 - 44 U/L    Comment: HEMOLYSIS AT THIS LEVEL MAY AFFECT RESULT   Alkaline Phosphatase 50 38 - 126 U/L   Total Bilirubin 1.5 (H) 0.3 - 1.2 mg/dL    Comment: HEMOLYSIS AT THIS LEVEL MAY AFFECT RESULT   GFR, Estimated >60 >60 mL/min    Comment: (NOTE) Calculated using the CKD-EPI Creatinine Equation (2021)    Anion gap 10 5 - 15    Comment: Performed at [facility], [address], [place], [place] [address]  CBC     Status: None   Collection Time: 07/03/22  7:59 PM  Result Value Ref  Range   WBC 9.5 4.0 - 10.5 K/uL   RBC 4.54 4.22 - 5.81 MIL/uL   Hemoglobin 14.2 13.0 - 17.0 g/dL   HCT 60.4 54.0 - 98.1 %   MCV 97.6 80.0 - 100.0 fL   MCH 31.3 26.0 - 34.0 pg   MCHC 32.1 30.0 - 36.0 g/dL   RDW 19.1 47.8 - 29.5 %   Platelets 176 150 - 400 K/uL   nRBC 0.0 0.0 - 0.2 %    Comment: Performed at [facility], [address], [place], [place] [address]  D-dimer, quantitative     Status: Abnormal   Collection Time: 07/03/22 10:45 PM  Result Value Ref Range   D-Dimer, Quant 0.61 (H) 0.00 - 0.50 ug/mL-FEU    Comment: (NOTE) At the manufacturer cut-off value of 0.5 g/mL FEU, this assay has a negative predictive value of 95-100%.This assay is intended for use in conjunction with a clinical pretest probability (PTP) assessment model to exclude pulmonary embolism (PE) and deep venous thrombosis (DVT) in outpatients suspected of PE or DVT. Results should be correlated with clinical presentation. Performed at [facility], [address] [place], [place] [address]   Brain natriuretic peptide     Status: None   Collection Time: 07/03/22 10:54 PM  Result Value Ref Range   B Natriuretic Peptide 51.6 0.0 - 100.0 pg/mL    Comment: Performed at [facility], [address], [place], [place] [address]  Troponin I (High Sensitivity)     Status: None   Collection Time: 07/03/22 10:55 PM  Result Value Ref Range   Troponin I (High Sensitivity) 3 <18 ng/L    Comment: (NOTE) Elevated high sensitivity troponin I (hsTnI) values and significant  changes across serial measurements may suggest ACS but many other  chronic and acute conditions are known to elevate hsTnI results.  Refer to the "Links" section for chest pain algorithms and additional  guidance. Performed at [facility], [address], [place], [place] [address]   Urinalysis, Routine w reflex microscopic -Urine, Clean Catch     Status: Abnormal    Collection Time: 07/03/22 11:40 PM  Result Value Ref Range   Color, Urine YELLOW YELLOW   APPearance CLEAR CLEAR   Specific Gravity, Urine 1.021 1.005 - 1.030   pH 5.0 5.0 - 8.0   Glucose, UA NEGATIVE NEGATIVE mg/dL   Hgb urine dipstick SMALL (A) NEGATIVE   Bilirubin Urine NEGATIVE NEGATIVE   Ketones, ur NEGATIVE NEGATIVE mg/dL  Protein, ur NEGATIVE NEGATIVE mg/dL   Nitrite NEGATIVE NEGATIVE   Leukocytes,Ua NEGATIVE NEGATIVE   RBC / HPF 0-5 0 - 5 RBC/hpf   WBC, UA 0-5 0 - 5 WBC/hpf   Bacteria, UA NONE SEEN NONE SEEN   Squamous Epithelial / HPF 0-5 0 - 5 /HPF   Mucus PRESENT     Comment: Performed at [facility] [address] [place], [place] [address]   CT Angio Chest PE W and/or Wo Contrast  Result Date: 07/04/2022 CLINICAL DATA:  Pulmonary embolism suspected. High probability. Right chest and shoulder pain. Right middle lobe infiltrate on PA and lateral chest yesterday. EXAM: CT ANGIOGRAPHY CHEST WITH CONTRAST TECHNIQUE: Multidetector CT imaging of the chest was performed using the standard protocol during bolus administration of intravenous contrast. Multiplanar CT image reconstructions and MIPs were obtained to evaluate the vascular anatomy. RADIATION DOSE REDUCTION: This exam was performed according to the departmental dose-optimization program which includes automated exposure control, adjustment of the mA and/or kV according to patient size and/or use of iterative reconstruction technique. CONTRAST:  80mL OMNIPAQUE IOHEXOL 350 MG/ML SOLN COMPARISON:  PA Lat chest yesterday, PA Lat chest 12/07/2010 FINDINGS: Cardiovascular: There is a partially occluding thrombus at the main branch division of the interlobar pulmonary artery, extending a short distance into the lateral segmental main artery and continuing with occluding thrombus extending down the medial right middle lobe segmental artery into its subsegmental branch divisions. In the right upper lobe, there is a  subsegmental branch point embolus which is nonoccluding, involving an apically directed artery on 4:76 and 14:90 and 91. The overall visualized clot burden is small and there are no findings of acute right heart strain. The cardiac size is normal. The pulmonary arteries are within normal caliber limits as are the pulmonary veins. The aorta and great vessels are normal apart from mild descending aortic tortuosity. No aortic plaques, aneurysm or dissection are seen. The great vessels are clear. There are no visible coronary calcifications and no pericardial effusion. Mediastinum/Nodes: No enlarged mediastinal, hilar, or axillary lymph nodes. Thyroid gland, trachea, and esophagus demonstrate no significant findings. The main bronchi are patent. Lungs/Pleura: There is patchy airspace disease in the right middle lobe, greater medially and most likely related to a pulmonary infarct given the above thrombus location. There is a small layering right pleural effusion with adjacent atelectasis in the posterior right base. Remaining lungs are clear of acute findings. There are multiple thin walled air cysts scattered throughout both lungs. They are more numerous in the upper lobes than elsewhere but are present throughout. The largest air cyst on the right is in the upper lobe base perihilar area and measures 1.8 cm on 2:74. The largest on the left straddles the fissure in the anterior lung base and measures 2.8 cm. Others are smaller and none of them have air-fluid levels in them. There is no pneumothorax. Upper Abdomen: No acute abnormality.  Moderate hepatic steatosis. Musculoskeletal: Bilateral gynecomastia right-greater-than-left. No chest wall mass. Multilevel thoracic spine bridging enthesopathy and spondylosis. Review of the MIP images confirms the above findings. IMPRESSION: 1. Right-sided pulmonary emboli with small clot burden and no findings of acute right heart strain. See above for details. 2. Right middle lobe  airspace disease most likely due to a pulmonary infarct. 3. Small layering right pleural effusion with adjacent atelectasis. 4. Multiple thin walled air cysts scattered throughout both lungs, more numerous in the upper lobes than elsewhere. Differential diagnosis would include Langerhans cell histiocytosis  and less likely lymphangioleiomyomatosis. 5. Moderate hepatic steatosis. 6. Bilateral gynecomastia right-greater-than-left. 7. Critical Value/emergent results were called by telephone at the time of interpretation on 07/04/2022 at 12:41 am to provider Vonita Moss , who verbally acknowledged these results. Electronically Signed   By: Almira Bar M.D.   On: 07/04/2022 00:42   US Abdomen Limited RUQ (LIVER/GB)  Result Date: 07/03/2022 CLINICAL DATA:  Right upper quadrant pain EXAM: ULTRASOUND ABDOMEN LIMITED RIGHT UPPER QUADRANT COMPARISON:  None Available. FINDINGS: Gallbladder: No gallstones or wall thickening visualized. No sonographic Murphy sign noted by sonographer. Common bile duct: Diameter: 4 mm Liver: No focal lesion identified. Increase in parenchymal echogenicity. Portal vein is patent on color Doppler imaging with normal direction of blood flow towards the liver. Other: None. IMPRESSION: Hepatic steatosis. Please note limited evaluation for focal hepatic masses in a patient with hepatic steatosis due to decreased penetration of the acoustic ultrasound waves. Electronically Signed   By: Darliss Cheney M.D.   On: 07/03/2022 22:36   DG Chest 2 View  Result Date: 07/03/2022 CLINICAL DATA:  Right lower lobe pain. EXAM: CHEST - 2 VIEW COMPARISON:  Often 02/04/2010 FINDINGS: Hazy airspace opacity in the right lower lobe may be due to atelectasis or pneumonia. No pleural effusion or pneumothorax. Normal cardiomediastinal silhouette. No displaced rib fractures. IMPRESSION: Right lower lung atelectasis or pneumonia. Electronically Signed   By: Minerva Fester M.D.   On: 07/03/2022 22:23    Pending  Labs Unresulted Labs (From admission, onward)     Start     Ordered   07/04/22 0500  CBC  Daily,   R      07/04/22 0104   07/04/22 0500  HIV Antibody (routine testing w rflx)  (HIV Antibody (Routine testing w reflex) panel)  Tomorrow morning,   R        07/04/22 0108   07/04/22 0500  Basic metabolic panel  Daily,   R      07/04/22 0108   07/04/22 0500  Hepatic function panel  Tomorrow morning,   R        07/04/22 0108            Vitals/Pain Today's Vitals   07/03/22 1928 07/03/22 2230 07/03/22 2340 07/04/22 0103  BP:  (!) 149/82    Pulse:  80    Resp:  18    Temp:   98.9 F (37.2 C)   TempSrc:   Oral   SpO2:  98%    Weight: 111.1 kg     Height: 6\' 4"  (1.93 m)     PainSc: 7   5  5      Isolation Precautions No active isolations  Medications Medications  heparin bolus via infusion 3,000 Units (has no administration in time range)  heparin ADULT infusion 100 units/mL (25000 units/257mL) (has no administration in time range)  rosuvastatin (CRESTOR) tablet 20 mg (has no administration in time range)  spironolactone (ALDACTONE) tablet 50 mg (has no administration in time range)  zolpidem (AMBIEN) tablet 5 mg (has no administration in time range)  sodium chloride flush (NS) 0.9 % injection 3 mL (has no administration in time range)  acetaminophen (TYLENOL) tablet 650 mg (has no administration in time range)    Or  acetaminophen (TYLENOL) suppository 650 mg (has no administration in time range)  oxyCODONE (Oxy IR/ROXICODONE) immediate release tablet 5 mg (has no administration in time range)  polyethylene glycol (MIRALAX / GLYCOLAX) packet 17 g (has no administration in time range)  ondansetron (ZOFRAN) tablet 4 mg (has no administration in time range)    Or  ondansetron (ZOFRAN) injection 4 mg (has no administration in time range)  irbesartan (AVAPRO) tablet 300 mg (has no administration in time range)    And  hydrochlorothiazide (HYDRODIURIL) tablet 25 mg (has no  administration in time range)  HYDROmorphone (DILAUDID) injection 0.5-1 mg (has no administration in time range)  ketorolac (TORADOL) 15 MG/ML injection 15 mg (15 mg Intravenous Given 07/03/22 2231)  doxycycline (VIBRA-TABS) tablet 100 mg (100 mg Oral Given 07/03/22 2336)  morphine (PF) 4 MG/ML injection 4 mg (4 mg Intravenous Given 07/03/22 2335)  iohexol (OMNIPAQUE) 350 MG/ML injection 80 mL (80 mLs Intravenous Contrast Given 07/04/22 0010)    Mobility walks     Focused Assessments Cardiac Assessment Handoff:    No results found for: "CKTOTAL", "CKMB", "CKMBINDEX", "TROPONINI" Lab Results  Component Value Date   DDIMER 0.61 (H) 07/03/2022   Does the Patient currently have chest pain? No    R Recommendations: See Admitting Provider Note  Report given to:   Additional Notes: aaox4, ambulates

## 2022-07-04 NOTE — H&P (Signed)
History and Physical    EUCLID ROSTKOWSKI ZOX:[id number] DOB: January 10, 1961 DOA: 07/03/2022  PCP: Wilfrid Lund, PA   Patient coming from: Home   Chief Complaint: RUQ pain   HPI: Wayne Cox is a pleasant 62 y.o. male with medical history significant for hypertension, hyperlipidemia, and OSA with CPAP intolerance who presents to the ED with right upper quadrant pain radiating to his back.   Patient was in his usual state the night of 07/02/2022 when he developed pain in his right lower chest/RUQ and around the right scapula.  He was also becoming more short of breath than usual when walking upstairs.  The following morning (yesterday), the pain had become severe.  With constant, unrelenting pain, eventually came into the ED.  He has had some mild right lower extremity cramping pains recently but has not noticed any swelling.  He denies any recent surgery, long distance travel, or other prolonged immobilization.  He denies personal history of VTE but states that his mother developed VTE while she was hospitalized, and a nephew also had clots but the circumstances are unclear.  ED Course: Upon arrival to the ED, patient is found to be afebrile and saturating well on room air with normal heart rate and stable blood pressure.  Labs are notable for normal troponin, normal BNP, and D-dimer 0.61.  Right upper quadrant ultrasound reveals hepatic steatosis.  CTA chest is notable for acute PE with small clot burden, no evidence for right heart strain, and right middle lobe airspace disease that is most likely infarction.  Thin-walled air cysts are also noted in bilateral lungs on CT.  Patient was treated with Toradol, morphine, doxycycline, and started on IV heparin in the ED.  Review of Systems:  All other systems reviewed and apart from HPI, are negative.  Past Medical History:  Diagnosis Date   Abnormal EKG    Dizziness    ED (erectile dysfunction)    Hypercholesterolemia    Hypertension    Insomnia     OSA on CPAP    moderate obstructive sleep apnea with an AHI of 25.7/h with no significant central events.  He underwent CPAP titration to 13 cm H2O.   Prediabetes    Upper back pain     Past Surgical History:  Procedure Laterality Date   BACK SURGERY      Social History:   reports that he has never smoked. He does not have any smokeless tobacco history on file. He reports that he does not drink alcohol and does not use drugs.  No Known Allergies  Family History  Problem Relation Age of Onset   Cancer Father    Cancer Sister    Hypertension Brother    Diabetes Brother    Cancer Brother    Kidney disease Brother      Prior to Admission medications   Medication Sig Start Date End Date Taking? Authorizing Provider  cyclobenzaprine (FLEXERIL) 10 MG tablet Take 10 mg by mouth 3 (three) times daily as needed for muscle spasms.    [provider]  Eszopiclone 3 MG TABS Take 1 tablet by mouth daily as needed (sleep).  10/29/14   [provider]  olmesartan-hydrochlorothiazide (BENICAR HCT) 40-25 MG tablet Take 1 tablet by mouth daily. 08/17/21   [provider]  rosuvastatin (CRESTOR) 20 MG tablet Take 20 mg by mouth daily. 12/25/21   [provider]  spironolactone (ALDACTONE) 50 MG tablet Take 50 mg by mouth daily.  [provider]  tadalafil (CIALIS) 10 MG tablet Take 10 mg by mouth daily as needed for erectile dysfunction.    [provider]    Physical Exam: Vitals:   07/03/22 1924 07/03/22 1928 07/03/22 2230 07/03/22 2340  BP: (!) 153/80  (!) 149/82   Pulse: 92  80   Resp: 18  18   Temp: 99.5 F (37.5 C)   98.9 F (37.2 C)  TempSrc: Oral   Oral  SpO2: 98%  98%   Weight:  111.1 kg    Height:  6\' 4"  (1.93 m)       Constitutional: NAD, grimacing   Eyes: PERTLA, lids and conjunctivae normal ENMT: Mucous membranes are moist. Posterior pharynx clear of any exudate or lesions.   Neck: supple, no masses   Respiratory: no wheezing, no crackles. No accessory muscle use.  Cardiovascular: S1 & S2 heard, regular rate and rhythm. No extremity edema.   Abdomen: No distension, no tenderness, soft. Bowel sounds active.  Musculoskeletal: no clubbing / cyanosis. No joint deformity upper and lower extremities.   Skin: no significant rashes, lesions, ulcers. Warm, dry, well-perfused. Neurologic: CN 2-12 grossly intact. Moving all extremities. Alert and oriented.  Psychiatric: Pleasant. Cooperative.    Labs and Imaging on Admission: I have personally reviewed following labs and imaging studies  CBC: Recent Labs  Lab 07/03/22 1959  WBC 9.5  HGB 14.2  HCT 44.3  MCV 97.6  PLT 176   Basic Metabolic Panel: Recent Labs  Lab 07/03/22 1959  NA 135  K 4.2  CL 102  CO2 23  GLUCOSE 111*  BUN 15  CREATININE 1.20  CALCIUM 8.5*   GFR: Estimated Creatinine Clearance: 86 mL/min (by C-G formula based on SCr of 1.2 mg/dL). Liver Function Tests: Recent Labs  Lab 07/03/22 1959  AST 26  ALT 25  ALKPHOS 50  BILITOT 1.5*  PROT 7.6  ALBUMIN 4.1   Recent Labs  Lab 07/03/22 1959  LIPASE 32   No results for input(s): "AMMONIA" in the last 168 hours. Coagulation Profile: No results for input(s): "INR", "PROTIME" in the last 168 hours. Cardiac Enzymes: No results for input(s): "CKTOTAL", "CKMB", "CKMBINDEX", "TROPONINI" in the last 168 hours. BNP (last 3 results) No results for input(s): "PROBNP" in the last 8760 hours. HbA1C: No results for input(s): "HGBA1C" in the last 72 hours. CBG: No results for input(s): "GLUCAP" in the last 168 hours. Lipid Profile: No results for input(s): "CHOL", "HDL", "LDLCALC", "TRIG", "CHOLHDL", "LDLDIRECT" in the last 72 hours. Thyroid Function Tests: No results for input(s): "TSH", "T4TOTAL", "FREET4", "T3FREE", "THYROIDAB" in the last 72 hours. Anemia Panel: No results for input(s): "VITAMINB12", "FOLATE", "FERRITIN", "TIBC", "IRON", "RETICCTPCT" in the  last 72 hours. Urine analysis:    Component Value Date/Time   COLORURINE YELLOW 07/03/2022 2340   APPEARANCEUR CLEAR 07/03/2022 2340   LABSPEC 1.021 07/03/2022 2340   PHURINE 5.0 07/03/2022 2340   GLUCOSEU NEGATIVE 07/03/2022 2340   HGBUR SMALL (A) 07/03/2022 2340   BILIRUBINUR NEGATIVE 07/03/2022 2340   KETONESUR NEGATIVE 07/03/2022 2340   PROTEINUR NEGATIVE 07/03/2022 2340   NITRITE NEGATIVE 07/03/2022 2340   LEUKOCYTESUR NEGATIVE 07/03/2022 2340   Sepsis Labs: @LABRCNTIP (procalcitonin:4,lacticidven:4) )No results found for this or any previous visit (from the past 240 hour(s)).   Radiological Exams on Admission: CT Angio Chest PE W and/or Wo Contrast  Result Date: 07/04/2022 CLINICAL DATA:  Pulmonary embolism suspected. High probability. Right chest and shoulder pain. Right middle lobe infiltrate on PA and  lateral chest yesterday. EXAM: CT ANGIOGRAPHY CHEST WITH CONTRAST TECHNIQUE: Multidetector CT imaging of the chest was performed using the standard protocol during bolus administration of intravenous contrast. Multiplanar CT image reconstructions and MIPs were obtained to evaluate the vascular anatomy. RADIATION DOSE REDUCTION: This exam was performed according to the departmental dose-optimization program which includes automated exposure control, adjustment of the mA and/or kV according to patient size and/or use of iterative reconstruction technique. CONTRAST:  80mL OMNIPAQUE IOHEXOL 350 MG/ML SOLN COMPARISON:  PA Lat chest yesterday, PA Lat chest 12/07/2010 FINDINGS: Cardiovascular: There is a partially occluding thrombus at the main branch division of the interlobar pulmonary artery, extending a short distance into the lateral segmental main artery and continuing with occluding thrombus extending down the medial right middle lobe segmental artery into its subsegmental branch divisions. In the right upper lobe, there is a subsegmental branch point embolus which is nonoccluding,  involving an apically directed artery on 4:76 and 14:90 and 91. The overall visualized clot burden is small and there are no findings of acute right heart strain. The cardiac size is normal. The pulmonary arteries are within normal caliber limits as are the pulmonary veins. The aorta and great vessels are normal apart from mild descending aortic tortuosity. No aortic plaques, aneurysm or dissection are seen. The great vessels are clear. There are no visible coronary calcifications and no pericardial effusion. Mediastinum/Nodes: No enlarged mediastinal, hilar, or axillary lymph nodes. Thyroid gland, trachea, and esophagus demonstrate no significant findings. The main bronchi are patent. Lungs/Pleura: There is patchy airspace disease in the right middle lobe, greater medially and most likely related to a pulmonary infarct given the above thrombus location. There is a small layering right pleural effusion with adjacent atelectasis in the posterior right base. Remaining lungs are clear of acute findings. There are multiple thin walled air cysts scattered throughout both lungs. They are more numerous in the upper lobes than elsewhere but are present throughout. The largest air cyst on the right is in the upper lobe base perihilar area and measures 1.8 cm on 2:74. The largest on the left straddles the fissure in the anterior lung base and measures 2.8 cm. Others are smaller and none of them have air-fluid levels in them. There is no pneumothorax. Upper Abdomen: No acute abnormality.  Moderate hepatic steatosis. Musculoskeletal: Bilateral gynecomastia right-greater-than-left. No chest wall mass. Multilevel thoracic spine bridging enthesopathy and spondylosis. Review of the MIP images confirms the above findings. IMPRESSION: 1. Right-sided pulmonary emboli with small clot burden and no findings of acute right heart strain. See above for details. 2. Right middle lobe airspace disease most likely due to a pulmonary infarct.  3. Small layering right pleural effusion with adjacent atelectasis. 4. Multiple thin walled air cysts scattered throughout both lungs, more numerous in the upper lobes than elsewhere. Differential diagnosis would include Langerhans cell histiocytosis and less likely lymphangioleiomyomatosis. 5. Moderate hepatic steatosis. 6. Bilateral gynecomastia right-greater-than-left. 7. Critical Value/emergent results were called by telephone at the time of interpretation on 07/04/2022 at 12:41 am to provider Vonita Moss , who verbally acknowledged these results. Electronically Signed   By: Almira Bar M.D.   On: 07/04/2022 00:42   US Abdomen Limited RUQ (LIVER/GB)  Result Date: 07/03/2022 CLINICAL DATA:  Right upper quadrant pain EXAM: ULTRASOUND ABDOMEN LIMITED RIGHT UPPER QUADRANT COMPARISON:  None Available. FINDINGS: Gallbladder: No gallstones or wall thickening visualized. No sonographic Murphy sign noted by sonographer. Common bile duct: Diameter: 4 mm Liver: No focal lesion identified.  Increase in parenchymal echogenicity. Portal vein is patent on color Doppler imaging with normal direction of blood flow towards the liver. Other: None. IMPRESSION: Hepatic steatosis. Please note limited evaluation for focal hepatic masses in a patient with hepatic steatosis due to decreased penetration of the acoustic ultrasound waves. Electronically Signed   By: Darliss Cheney M.D.   On: 07/03/2022 22:36   DG Chest 2 View  Result Date: 07/03/2022 CLINICAL DATA:  Right lower lobe pain. EXAM: CHEST - 2 VIEW COMPARISON:  Often 02/04/2010 FINDINGS: Hazy airspace opacity in the right lower lobe may be due to atelectasis or pneumonia. No pleural effusion or pneumothorax. Normal cardiomediastinal silhouette. No displaced rib fractures. IMPRESSION: Right lower lung atelectasis or pneumonia. Electronically Signed   By: Minerva Fester M.D.   On: 07/03/2022 22:23    EKG: Independently reviewed. Sinus rhythm.   Assessment/Plan    1. Acute PE; pulmonary infarct   - Small clot burden with no right heart strain on CT, he is hemodynamically stable, and cardiac enzymes are normal but he is requiring IV opiates for pain-control  - Continue IV heparin, continue pain-control    2. Hypertension  - Continue Benicar and Aldactone    3. OSA  - Not tolerating CPAP, has been referred to ENT to evaluate for Inspire device    4. Cystic lung lesions  - Non-specific cystic lesions noted b/l, DDx includes Langerhans cell histiocytosis and less likely lymphangioleiomyomatosis  - Pulmonology follow-up recommended    DVT prophylaxis: IV heparin  Code Status: Full  Level of Care: Level of care: Telemetry Family Communication: None present  Disposition Plan:  Patient is from: home  Anticipated d/c is to: Home  Anticipated d/c date is: 07/06/22  Patient currently: Pending pain-control, transition to oral anticoagulant  Consults called: none  Admission status: Inpatient    Briscoe Deutscher, MD [facility]  07/04/2022, 1:08 AM

## 2022-07-04 NOTE — Progress Notes (Signed)
ANTICOAGULATION CONSULT NOTE - Follow Up Consult  Pharmacy Consult for Heparin Indication: Acute Pulmonary Embolus  No Known Allergies  Patient Measurements: Height: 6\' 4"  (193 cm) Weight: 111.1 kg (245 lb) IBW/kg (Calculated) : 86.8 Heparin Dosing Weight: 109.3  Vital Signs: Temp: 97.5 F (36.4 C) (06/18 0546) Temp Source: Oral (06/18 0216) BP: 125/75 (06/18 0546) Pulse Rate: 77 (06/18 0546)  Labs: Recent Labs    07/03/22 1959 07/03/22 2255 07/04/22 0523  HGB 14.2  --  13.9  HCT 44.3  --  41.9  PLT 176  --  203  CREATININE 1.20  --  1.33*  TROPONINIHS  --  3  --     Estimated Creatinine Clearance: 77.6 mL/min (A) (by C-G formula based on SCr of 1.33 mg/dL (H)).    Assessment: Patient is a 62 y.o M who presented to the ED on 6/17 with c/o right upper quadrant pain radiating to his back. Pt has no hx of VTE. Chest CT on 6/17 showed findings consistent with acute PE with small clot burden, no evidence for right heart strain.Pt  is not on anticoagulation PTA. LE doppler done on 6/18 showed no evidence of DVT.  Heparin drip started on admission for VTE treatment.  Today, 07/04/2022;  - First Heparin level is therapeutic at 0.64 - CBC stable  - No bleeding documented   Goal of Therapy:  - Heparin level 0.3-0.7 units/ml - Monitor platelets by anticoagulation protocol: Yes   Plan:  - Continue heparin drip at 1800 units/hr - Recheck a 6 hour heparin level at 3 pm to ensure level remains therapeutic before changing to daily monitoring  - Continue to monitor H&H and platelets and for signs/symptoms of bleeding    Erasmo Leventhal, PharmD Candidate 07/04/2022,9:38 AM

## 2022-07-04 NOTE — Progress Notes (Signed)
Bilateral lower extremity venous duplex has been completed. Preliminary results can be found in CV Proc through chart review.   07/04/22 8:33 AM Olen Cordial RVT

## 2022-07-04 NOTE — Progress Notes (Signed)
Pharmacy Brief Note - Evening Anticoagulation Follow Up:   Patient is a 62 y.o M on IV heparin infusion for PE.  For full history, see note by A. Thera Flake, PharmD student from earlier today.  Assessment: 14:33 confirmatory heparin level therapeutic at 0.44 with heparin infusing at 1800 units/hr Per nurse, no bleeding or heparin related concerns  Goal heparin level: 0.3-0.7  Plan: Continue IV heparin at 1800 units/hr Monitor daily heparin level, CBC, signs/symptoms of bleeding   Thank you for allowing pharmacy to be a part of this patient's care.  Selinda Eon, PharmD, BCPS Clinical Pharmacist [place] Please utilize Amion for appropriate phone number to reach the unit pharmacist [facility]) 07/04/2022 4:12 PM

## 2022-07-04 NOTE — ED Notes (Signed)
pt complained of nausea and sweats within of starting heparin . Infused paused, md notified

## 2022-07-05 DIAGNOSIS — I2699 Other pulmonary embolism without acute cor pulmonale: Secondary | ICD-10-CM | POA: Diagnosis not present

## 2022-07-05 LAB — HEPARIN LEVEL (UNFRACTIONATED): Heparin Unfractionated: 0.34 IU/mL (ref 0.30–0.70)

## 2022-07-05 LAB — CBC
HCT: 42.4 % (ref 39.0–52.0)
Hemoglobin: 14 g/dL (ref 13.0–17.0)
MCH: 31.5 pg (ref 26.0–34.0)
MCHC: 33 g/dL (ref 30.0–36.0)
MCV: 95.5 fL (ref 80.0–100.0)
Platelets: 216 10*3/uL (ref 150–400)
RBC: 4.44 MIL/uL (ref 4.22–5.81)
RDW: 13 % (ref 11.5–15.5)
WBC: 15.6 10*3/uL — ABNORMAL HIGH (ref 4.0–10.5)
nRBC: 0 % (ref 0.0–0.2)

## 2022-07-05 LAB — BASIC METABOLIC PANEL
Anion gap: 9 (ref 5–15)
BUN: 25 mg/dL — ABNORMAL HIGH (ref 8–23)
CO2: 28 mmol/L (ref 22–32)
Calcium: 8.6 mg/dL — ABNORMAL LOW (ref 8.9–10.3)
Chloride: 96 mmol/L — ABNORMAL LOW (ref 98–111)
Creatinine, Ser: 1.69 mg/dL — ABNORMAL HIGH (ref 0.61–1.24)
GFR, Estimated: 45 mL/min — ABNORMAL LOW (ref >60)
Glucose, Bld: 110 mg/dL — ABNORMAL HIGH (ref 70–99)
Potassium: 3.8 mmol/L (ref 3.5–5.1)
Sodium: 133 mmol/L — ABNORMAL LOW (ref 135–145)

## 2022-07-05 MED ORDER — AMLODIPINE BESYLATE 10 MG PO TABS
10.0000 mg | ORAL_TABLET | Freq: Every day | ORAL | Status: DC
  Administered 2022-07-05 – 2022-07-08 (×4): 10 mg via ORAL
  Filled 2022-07-05 (×4): qty 1

## 2022-07-05 MED ORDER — SODIUM CHLORIDE 0.9 % IV SOLN: INTRAVENOUS | Status: AC

## 2022-07-05 MED ORDER — OXYCODONE HCL 5 MG PO TABS
5.0000 mg | ORAL_TABLET | ORAL | Status: DC | PRN
  Administered 2022-07-05 – 2022-07-08 (×11): 10 mg via ORAL
  Filled 2022-07-05 (×11): qty 2

## 2022-07-05 MED ORDER — OXYCODONE HCL 5 MG PO TABS: 5.0000 mg | ORAL_TABLET | ORAL | Status: DC | PRN

## 2022-07-05 NOTE — Progress Notes (Signed)
ANTICOAGULATION CONSULT NOTE - Follow Up Consult  Pharmacy Consult for Heparin Indication: Acute Pulmonary Embolus  No Known Allergies  Patient Measurements: Height: 6\' 4"  (193 cm) Weight: 111.1 kg (245 lb) IBW/kg (Calculated) : 86.8 Heparin Dosing Weight: 109.3  Vital Signs: Temp: 99.3 F (37.4 C) (06/19 0504) BP: 120/68 (06/19 0504) Pulse Rate: 86 (06/19 0504)  Labs: Recent Labs    07/03/22 1959 07/03/22 2255 07/04/22 0523 07/04/22 0911 07/04/22 1433 07/05/22 0542  HGB 14.2  --  13.9  --   --  14.0  HCT 44.3  --  41.9  --   --  42.4  PLT 176  --  203  --   --  216  HEPARINUNFRC  --   --   --  0.64 0.44 0.34  CREATININE 1.20  --  1.33*  --   --  1.69*  TROPONINIHS  --  3  --   --   --   --     Estimated Creatinine Clearance: 61.1 mL/min (A) (by C-G formula based on SCr of 1.69 mg/dL (H)).    Assessment: Patient is a 62 y.o M who presented to the ED on 6/17 with c/o right upper quadrant pain radiating to his back. Pt has no hx of VTE. Chest CT on 6/17 showed findings consistent with acute PE with small clot burden, no evidence for right heart strain.Pt  is not on anticoagulation PTA. LE doppler done on 6/18 showed no evidence of DVT. Echocardiogram was done on 6/19 and was normal.  Heparin drip started on admission for VTE treatment.   Today, 07/05/2022: - Heparin level is therapeutic at 0.34 - CBC is stable  - No bleeding documented  -Scr: 1.69  Goal of Therapy:  Heparin level 0.3-0.7 units/ml Monitor platelets by anticoagulation protocol: Yes   Plan:   - Continue heparin drip at 1800 units/hr - Daily heparin levels and CBC  - Continue to monitor H&H and platelets and for signs/symptoms of bleeding - Plan to transition to DOAC prior to discharge  Erasmo Leventhal, PharmD Candidate  07/05/2022,7:30 AM

## 2022-07-05 NOTE — Progress Notes (Signed)
PROGRESS NOTE  Wayne Cox JWJ:[id number] DOB: 1960/02/14 DOA: 07/03/2022 PCP: Wilfrid Lund, PA  HPI/Recap of past 24 hours: Wayne Cox is a pleasant 62 y.o. male with medical history significant for HTN, HLD and OSA with CPAP intolerance who presents to the ED with worsening right lower chest/RUQ pain and around the right scapula with associated SOB X 2 days. Pt denies any recent surgery, long distance travel (although he reports commuting to work for about an hour to go on an hour to come back),  other prolonged immobilization.  He denies personal history of VTE but states that his mother developed VTE while she was hospitalized, and a nephew also had clots but the circumstances are unclear.  In the ED, vital signs fairly stable, D-dimer 0.61. CTA chest is notable for acute PE with small clot burden, no evidence for right heart strain, and right middle lobe airspace disease that is most likely infarction.  Thin-walled air cysts are also noted in bilateral lungs on CT. Patient was started on IV heparin in the ED and admitted for further management.    Today, patient still reporting significant pain in right-sided chest likely 2/2 pulmonary infarction.  Discussed extensively with patient.    Assessment/Plan: Principal Problem:   Pulmonary embolism (HCC) Active Problems:   OSA on CPAP   Hypertension   Multiple idiopathic pulmonary cysts   Acute pulmonary embolism Pulmonary infarction ??Unprovoked Currently on room air, saturating well CTA chest with small clot burden, no right heart strain, noted pulmonary infarction Bilateral Doppler ultrasound negative Echo with a EF of 60 to 65%, no regional wall motion abnormality, grade 1 diastolic dysfunction Continue IV heparin, plan to switch to DOAC on 6/20 Continue pain management  AKI Likely 2/2 poor oral intake in addition to recent contrast and medication Start IV fluids Hold home Benicar, Aldactone Daily  BMP  Leukocytosis Currently afebrile UA unremarkable Likely 2/2 reactive from pulmonary infarction Monitor closely for worsening respiratory symptoms Daily CBC  Hypertension BP stable Continue amlodipine, hold Benicar and Aldactone as above  OSA Not tolerating CPAP Has been referred to ENT to evaluate for inspire device  Cystic lung lesions Nonspecific cystic lesions noted bilaterally, differential diagnosis includes Langerhans cell histiocytosis and less likely lymphangioleiomyomatosis Recommend pulmonary follow-up      Estimated body mass index is 29.82 kg/m as calculated from the following:   Height as of this encounter: 6\' 4"  (1.93 m).   Weight as of this encounter: 111.1 kg.     Code Status: Full  Family Communication: None at bedside  Disposition Plan: Status is: Inpatient Remains inpatient appropriate because: Level of care      Consultants: None  Procedures: None  Antimicrobials: None  DVT prophylaxis: IV heparin   Objective: Vitals:   07/04/22 1539 07/04/22 1957 07/05/22 0504 07/05/22 1249  BP: (!) 170/89 (!) 141/73 120/68 131/68  Pulse: 94 97 86 95  Resp: 18 18 19 18   Temp: 98 F (36.7 C) 99.4 F (37.4 C) 99.3 F (37.4 C) 98.1 F (36.7 C)  TempSrc:    Oral  SpO2: 97% 100% 98% 100%  Weight:      Height:        Intake/Output Summary (Last 24 hours) at 07/05/2022 1259 Last data filed at 07/05/2022 0945 Gross per 24 hour  Intake 888.25 ml  Output --  Net 888.25 ml   Filed Weights   07/03/22 1928  Weight: 111.1 kg    Exam: General: NAD  Cardiovascular:  S1, S2 present Respiratory: CTAB Abdomen: Soft, nontender, nondistended, bowel sounds present Musculoskeletal: No bilateral pedal edema noted Skin: Normal Psychiatry: Normal mood     Data Reviewed: CBC: Recent Labs  Lab 07/03/22 1959 07/04/22 0523 07/05/22 0542  WBC 9.5 11.6* 15.6*  HGB 14.2 13.9 14.0  HCT 44.3 41.9 42.4  MCV 97.6 93.3 95.5  PLT 176 203 216    Basic Metabolic Panel: Recent Labs  Lab 07/03/22 1959 07/04/22 0523 07/05/22 0542  NA 135 136 133*  K 4.2 3.7 3.8  CL 102 102 96*  CO2 23 27 28   GLUCOSE 111* 141* 110*  BUN 15 15 25*  CREATININE 1.20 1.33* 1.69*  CALCIUM 8.5* 8.7* 8.6*   GFR: Estimated Creatinine Clearance: 61.1 mL/min (A) (by C-G formula based on SCr of 1.69 mg/dL (H)). Liver Function Tests: Recent Labs  Lab 07/03/22 1959 07/04/22 0523  AST 26 19  ALT 25 25  ALKPHOS 50 49  BILITOT 1.5* 1.1  PROT 7.6 7.7  ALBUMIN 4.1 4.1   Recent Labs  Lab 07/03/22 1959  LIPASE 32   No results for input(s): "AMMONIA" in the last 168 hours. Coagulation Profile: No results for input(s): "INR", "PROTIME" in the last 168 hours. Cardiac Enzymes: No results for input(s): "CKTOTAL", "CKMB", "CKMBINDEX", "TROPONINI" in the last 168 hours. BNP (last 3 results) No results for input(s): "PROBNP" in the last 8760 hours. HbA1C: No results for input(s): "HGBA1C" in the last 72 hours. CBG: No results for input(s): "GLUCAP" in the last 168 hours. Lipid Profile: No results for input(s): "CHOL", "HDL", "LDLCALC", "TRIG", "CHOLHDL", "LDLDIRECT" in the last 72 hours. Thyroid Function Tests: No results for input(s): "TSH", "T4TOTAL", "FREET4", "T3FREE", "THYROIDAB" in the last 72 hours. Anemia Panel: No results for input(s): "VITAMINB12", "FOLATE", "FERRITIN", "TIBC", "IRON", "RETICCTPCT" in the last 72 hours. Urine analysis:    Component Value Date/Time   COLORURINE YELLOW 07/03/2022 2340   APPEARANCEUR CLEAR 07/03/2022 2340   LABSPEC 1.021 07/03/2022 2340   PHURINE 5.0 07/03/2022 2340   GLUCOSEU NEGATIVE 07/03/2022 2340   HGBUR SMALL (A) 07/03/2022 2340   BILIRUBINUR NEGATIVE 07/03/2022 2340   KETONESUR NEGATIVE 07/03/2022 2340   PROTEINUR NEGATIVE 07/03/2022 2340   NITRITE NEGATIVE 07/03/2022 2340   LEUKOCYTESUR NEGATIVE 07/03/2022 2340   Sepsis Labs: @LABRCNTIP (procalcitonin:4,lacticidven:4)  )No results found  for this or any previous visit (from the past 240 hour(s)).    Studies: ECHOCARDIOGRAM COMPLETE  Result Date: 07/04/2022    ECHOCARDIOGRAM REPORT   Patient Name:   Wayne Cox Date of Exam: 07/04/2022 Medical Rec #:  [id number]      Height:       76.0 in Accession #:    [id number]     Weight:       245.0 lb Date of Birth:  06-Feb-1960       BSA:          2.415 m Patient Age:    61 years       BP:           170/89 mmHg Patient Gender: M              HR:           95 bpm. Exam Location:  Inpatient Procedure: 2D Echo, Cardiac Doppler and Color Doppler Indications:    Pulmonary Embolus  History:        Patient has prior history of Echocardiogram examinations, most  recent 09/26/2021. Signs/Symptoms:Shortness of Breath; Risk                 Factors:Sleep Apnea and Hypertension.  Sonographer:    Wallie Char Referring Phys: [id number] JENNIFER CHOI  Sonographer Comments: Technically difficult exam due to patient being unable to tolerate laying in the bed. IMPRESSIONS  1. Left ventricular ejection fraction, by estimation, is 60 to 65%. The left ventricle has normal function. The left ventricle has no regional wall motion abnormalities. Left ventricular diastolic parameters are consistent with Grade I diastolic dysfunction (impaired relaxation).  2. Right ventricular systolic function is normal. The right ventricular size is normal. There is mildly elevated pulmonary artery systolic pressure.  3. The mitral valve is normal in structure. No evidence of mitral valve regurgitation. No evidence of mitral stenosis.  4. The aortic valve is normal in structure. Aortic valve regurgitation is not visualized. No aortic stenosis is present.  5. The inferior vena cava is normal in size with greater than 50% respiratory variability, suggesting right atrial pressure of 3 mmHg. Comparison(s): No significant change from prior study. Prior images reviewed side by side. FINDINGS  Left Ventricle: Left ventricular ejection  fraction, by estimation, is 60 to 65%. The left ventricle has normal function. The left ventricle has no regional wall motion abnormalities. The left ventricular internal cavity size was normal in size. There is  no left ventricular hypertrophy. Left ventricular diastolic parameters are consistent with Grade I diastolic dysfunction (impaired relaxation). Right Ventricle: The right ventricular size is normal. No increase in right ventricular wall thickness. Right ventricular systolic function is normal. There is mildly elevated pulmonary artery systolic pressure. The tricuspid regurgitant velocity is 3.00  m/s, and with an assumed right atrial pressure of 3 mmHg, the estimated right ventricular systolic pressure is 39.0 mmHg. Left Atrium: Left atrial size was normal in size. Right Atrium: Right atrial size was normal in size. Pericardium: There is no evidence of pericardial effusion. Mitral Valve: The mitral valve is normal in structure. No evidence of mitral valve regurgitation. No evidence of mitral valve stenosis. MV peak gradient, 4.8 mmHg. The mean mitral valve gradient is 2.0 mmHg. Tricuspid Valve: The tricuspid valve is normal in structure. Tricuspid valve regurgitation is not demonstrated. No evidence of tricuspid stenosis. Aortic Valve: The aortic valve is normal in structure. Aortic valve regurgitation is not visualized. No aortic stenosis is present. Aortic valve mean gradient measures 5.0 mmHg. Aortic valve peak gradient measures 8.6 mmHg. Aortic valve area, by VTI measures 3.27 cm. Pulmonic Valve: The pulmonic valve was grossly normal. Pulmonic valve regurgitation is not visualized. No evidence of pulmonic stenosis. Aorta: The aortic root and ascending aorta are structurally normal, with no evidence of dilitation. Venous: The inferior vena cava is normal in size with greater than 50% respiratory variability, suggesting right atrial pressure of 3 mmHg. IAS/Shunts: No atrial level shunt detected by color  flow Doppler.  LEFT VENTRICLE PLAX 2D LVIDd:         4.50 cm      Diastology LVIDs:         2.90 cm      LV e' medial:    9.74 cm/s LV PW:         1.20 cm      LV E/e' medial:  9.3 LV IVS:        1.20 cm      LV e' lateral:   8.17 cm/s LVOT diam:     2.20 cm  LV E/e' lateral: 11.1 LV SV:         83 LV SV Index:   34 LVOT Area:     3.80 cm  LV Volumes (MOD) LV vol d, MOD A2C: 106.0 ml LV vol d, MOD A4C: 87.3 ml LV vol s, MOD A2C: 32.3 ml LV vol s, MOD A4C: 27.3 ml LV SV MOD A2C:     73.7 ml LV SV MOD A4C:     87.3 ml LV SV MOD BP:      73.1 ml RIGHT VENTRICLE RV Basal diam:  3.50 cm RV S prime:     15.20 cm/s TAPSE (M-mode): 2.2 cm LEFT ATRIUM             Index        RIGHT ATRIUM           Index LA diam:        3.40 cm 1.41 cm/m   RA Area:     12.20 cm LA Vol (A2C):   31.7 ml 13.13 ml/m  RA Volume:   27.00 ml  11.18 ml/m LA Vol (A4C):   32.4 ml 13.42 ml/m LA Biplane Vol: 32.8 ml 13.58 ml/m  AORTIC VALVE AV Area (Vmax):    3.09 cm AV Area (Vmean):   2.98 cm AV Area (VTI):     3.27 cm AV Vmax:           146.50 cm/s AV Vmean:          100.600 cm/s AV VTI:            0.254 m AV Peak Grad:      8.6 mmHg AV Mean Grad:      5.0 mmHg LVOT Vmax:         119.00 cm/s LVOT Vmean:        78.850 cm/s LVOT VTI:          0.218 m LVOT/AV VTI ratio: 0.86  AORTA Ao Root diam: 3.30 cm Ao Asc diam:  2.90 cm MITRAL VALVE               TRICUSPID VALVE MV Area (PHT): 6.71 cm    TR Peak grad:   36.0 mmHg MV Area VTI:   4.06 cm    TR Vmax:        300.00 cm/s MV Peak grad:  4.8 mmHg MV Mean grad:  2.0 mmHg    SHUNTS MV Vmax:       1.09 m/s    Systemic VTI:  0.22 m MV Vmean:      73.6 cm/s   Systemic Diam: 2.20 cm MV Decel Time: 113 msec MV E velocity: 90.40 cm/s MV A velocity: 99.30 cm/s MV E/A ratio:  0.91 Mihai Croitoru Wayne Cox Electronically signed by Thurmon Fair Wayne Cox Signature Date/Time: 07/04/2022/4:06:53 PM    Final     Scheduled Meds:  amLODipine  10 mg Oral Daily   rosuvastatin  20 mg Oral Daily   sodium chloride flush   3 mL Intravenous Q12H    Continuous Infusions:  sodium chloride 75 mL/hr at 07/05/22 1229   heparin 1,800 Units/hr (07/05/22 0722)     LOS: 1 day     Wayne Cedar, Wayne Cox [facility]  If 7PM-7AM, please contact night-coverage [email or web address] 07/05/2022, 12:59 PM

## 2022-07-05 NOTE — Plan of Care (Signed)

## 2022-07-06 ENCOUNTER — Inpatient Hospital Stay (HOSPITAL_COMMUNITY): Payer: Federal, State, Local not specified - PPO

## 2022-07-06 DIAGNOSIS — I2699 Other pulmonary embolism without acute cor pulmonale: Secondary | ICD-10-CM | POA: Diagnosis not present

## 2022-07-06 LAB — BASIC METABOLIC PANEL
Anion gap: 6 (ref 5–15)
BUN: 23 mg/dL (ref 8–23)
CO2: 27 mmol/L (ref 22–32)
Calcium: 8.3 mg/dL — ABNORMAL LOW (ref 8.9–10.3)
Chloride: 101 mmol/L (ref 98–111)
Creatinine, Ser: 1.49 mg/dL — ABNORMAL HIGH (ref 0.61–1.24)
GFR, Estimated: 52 mL/min — ABNORMAL LOW (ref >60)
Glucose, Bld: 104 mg/dL — ABNORMAL HIGH (ref 70–99)
Potassium: 3.6 mmol/L (ref 3.5–5.1)
Sodium: 134 mmol/L — ABNORMAL LOW (ref 135–145)

## 2022-07-06 LAB — CBC
HCT: 42.2 % (ref 39.0–52.0)
Hemoglobin: 13.6 g/dL (ref 13.0–17.0)
MCH: 31.3 pg (ref 26.0–34.0)
MCHC: 32.2 g/dL (ref 30.0–36.0)
MCV: 97 fL (ref 80.0–100.0)
Platelets: 223 10*3/uL (ref 150–400)
RBC: 4.35 MIL/uL (ref 4.22–5.81)
RDW: 12.9 % (ref 11.5–15.5)
WBC: 12.8 10*3/uL — ABNORMAL HIGH (ref 4.0–10.5)
nRBC: 0 % (ref 0.0–0.2)

## 2022-07-06 LAB — HEPARIN LEVEL (UNFRACTIONATED): Heparin Unfractionated: <0.1 IU/mL — ABNORMAL LOW (ref 0.30–0.70)

## 2022-07-06 MED ORDER — HYDROMORPHONE HCL 1 MG/ML IJ SOLN
0.5000 mg | INTRAMUSCULAR | Status: DC | PRN
  Administered 2022-07-06: 1 mg via INTRAVENOUS
  Filled 2022-07-06: qty 1

## 2022-07-06 MED ORDER — SODIUM CHLORIDE 0.9 % IV SOLN: INTRAVENOUS | Status: DC

## 2022-07-06 MED ORDER — APIXABAN 5 MG PO TABS
10.0000 mg | ORAL_TABLET | Freq: Two times a day (BID) | ORAL | Status: DC
  Administered 2022-07-06 – 2022-07-08 (×5): 10 mg via ORAL
  Filled 2022-07-06 (×5): qty 2

## 2022-07-06 MED ORDER — APIXABAN 5 MG PO TABS: 5.0000 mg | ORAL_TABLET | Freq: Two times a day (BID) | ORAL | Status: DC

## 2022-07-06 MED ORDER — HEPARIN BOLUS VIA INFUSION
2000.0000 [IU] | Freq: Once | INTRAVENOUS | Status: AC
  Administered 2022-07-06: 2000 [IU] via INTRAVENOUS
  Filled 2022-07-06: qty 2000

## 2022-07-06 MED ORDER — SENNA 8.6 MG PO TABS
1.0000 | ORAL_TABLET | Freq: Every day | ORAL | Status: DC
  Administered 2022-07-06 – 2022-07-07 (×2): 8.6 mg via ORAL
  Filled 2022-07-06 (×2): qty 1

## 2022-07-06 MED ORDER — POLYETHYLENE GLYCOL 3350 17 G PO PACK
17.0000 g | PACK | Freq: Two times a day (BID) | ORAL | Status: DC
  Administered 2022-07-06 – 2022-07-07 (×4): 17 g via ORAL
  Filled 2022-07-06 (×4): qty 1

## 2022-07-06 NOTE — Progress Notes (Signed)
PROGRESS NOTE  Wayne Cox ZOX:[id number] DOB: 10-12-1960 DOA: 07/03/2022 PCP: Wilfrid Lund, PA  HPI/Recap of past 24 hours: Wayne Cox is a pleasant 62 y.o. male with medical history significant for HTN, HLD and OSA with CPAP intolerance who presents to the ED with worsening right lower chest/RUQ pain and around the right scapula with associated SOB X 2 days. Pt denies any recent surgery, long distance travel (although he reports commuting to work for about an hour to go on an hour to come back),  other prolonged immobilization.  He denies personal history of VTE but states that his mother developed VTE while she was hospitalized, and a nephew also had clots but the circumstances are unclear.  In the ED, vital signs fairly stable, D-dimer 0.61. CTA chest is notable for acute PE with small clot burden, no evidence for right heart strain, and right middle lobe airspace disease that is most likely infarction.  Thin-walled air cysts are also noted in bilateral lungs on CT. Patient was started on IV heparin in the ED and admitted for further management.    Today, patient still reporting significant right-sided chest pain, likely from pulmonary infarction.  Denies any other new complaints.    Assessment/Plan: Principal Problem:   Pulmonary embolism (HCC) Active Problems:   OSA on CPAP   Hypertension   Multiple idiopathic pulmonary cysts   Acute pulmonary embolism Pulmonary infarction ??Unprovoked Currently on room air, saturating well CTA chest with small clot burden, no right heart strain, noted pulmonary infarction Bilateral Doppler ultrasound negative Echo with a EF of 60 to 65%, no regional wall motion abnormality, grade 1 diastolic dysfunction Switch to DOAC, eliquis Continue pain management  AKI Likely 2/2 poor oral intake in addition to recent contrast and medication Start IV fluids Hold home Benicar, Aldactone Daily BMP  Leukocytosis Currently afebrile UA  unremarkable Likely 2/2 reactive from pulmonary infarction Repeat CXR pending Monitor closely for worsening respiratory symptoms Daily CBC  Hypertension BP stable Continue amlodipine, hold Benicar and Aldactone as above  OSA Not tolerating CPAP Has been referred to ENT to evaluate for inspire device  Cystic lung lesions Nonspecific cystic lesions noted bilaterally, differential diagnosis includes Langerhans cell histiocytosis and less likely lymphangioleiomyomatosis Recommend pulmonary follow-up      Estimated body mass index is 29.82 kg/m as calculated from the following:   Height as of this encounter: 6\' 4"  (1.93 m).   Weight as of this encounter: 111.1 kg.     Code Status: Full  Family Communication: Discussed with wife over the phone on 6/20  Disposition Plan: Status is: Inpatient Remains inpatient appropriate because: Level of care      Consultants: None  Procedures: None  Antimicrobials: None  DVT prophylaxis: IV heparin-->PO Eliquis   Objective: Vitals:   07/05/22 1249 07/05/22 1945 07/06/22 0556 07/06/22 1227  BP: 131/68 (!) 150/82 (!) 156/72 130/84  Pulse: 95 (!) 107 (!) 103 95  Resp: 18 20 17 18   Temp: 98.1 F (36.7 C) 98.3 F (36.8 C) 98.6 F (37 C) 98.9 F (37.2 C)  TempSrc: Oral Oral Oral Oral  SpO2: 100% 97% 99% 99%  Weight:      Height:       No intake or output data in the 24 hours ending 07/06/22 1416  Filed Weights   07/03/22 1928  Weight: 111.1 kg    Exam: General: NAD  Cardiovascular: S1, S2 present Respiratory: CTAB Abdomen: Soft, nontender, nondistended, bowel sounds present Musculoskeletal: No  bilateral pedal edema noted Skin: Normal Psychiatry: Normal mood     Data Reviewed: CBC: Recent Labs  Lab 07/03/22 1959 07/04/22 0523 07/05/22 0542 07/06/22 0548  WBC 9.5 11.6* 15.6* 12.8*  HGB 14.2 13.9 14.0 13.6  HCT 44.3 41.9 42.4 42.2  MCV 97.6 93.3 95.5 97.0  PLT 176 203 216 223   Basic Metabolic  Panel: Recent Labs  Lab 07/03/22 1959 07/04/22 0523 07/05/22 0542 07/06/22 0548  NA 135 136 133* 134*  K 4.2 3.7 3.8 3.6  CL 102 102 96* 101  CO2 23 27 28 27   GLUCOSE 111* 141* 110* 104*  BUN 15 15 25* 23  CREATININE 1.20 1.33* 1.69* 1.49*  CALCIUM 8.5* 8.7* 8.6* 8.3*   GFR: Estimated Creatinine Clearance: 69.3 mL/min (A) (by C-G formula based on SCr of 1.49 mg/dL (H)). Liver Function Tests: Recent Labs  Lab 07/03/22 1959 07/04/22 0523  AST 26 19  ALT 25 25  ALKPHOS 50 49  BILITOT 1.5* 1.1  PROT 7.6 7.7  ALBUMIN 4.1 4.1   Recent Labs  Lab 07/03/22 1959  LIPASE 32   No results for input(s): "AMMONIA" in the last 168 hours. Coagulation Profile: No results for input(s): "INR", "PROTIME" in the last 168 hours. Cardiac Enzymes: No results for input(s): "CKTOTAL", "CKMB", "CKMBINDEX", "TROPONINI" in the last 168 hours. BNP (last 3 results) No results for input(s): "PROBNP" in the last 8760 hours. HbA1C: No results for input(s): "HGBA1C" in the last 72 hours. CBG: No results for input(s): "GLUCAP" in the last 168 hours. Lipid Profile: No results for input(s): "CHOL", "HDL", "LDLCALC", "TRIG", "CHOLHDL", "LDLDIRECT" in the last 72 hours. Thyroid Function Tests: No results for input(s): "TSH", "T4TOTAL", "FREET4", "T3FREE", "THYROIDAB" in the last 72 hours. Anemia Panel: No results for input(s): "VITAMINB12", "FOLATE", "FERRITIN", "TIBC", "IRON", "RETICCTPCT" in the last 72 hours. Urine analysis:    Component Value Date/Time   COLORURINE YELLOW 07/03/2022 2340   APPEARANCEUR CLEAR 07/03/2022 2340   LABSPEC 1.021 07/03/2022 2340   PHURINE 5.0 07/03/2022 2340   GLUCOSEU NEGATIVE 07/03/2022 2340   HGBUR SMALL (A) 07/03/2022 2340   BILIRUBINUR NEGATIVE 07/03/2022 2340   KETONESUR NEGATIVE 07/03/2022 2340   PROTEINUR NEGATIVE 07/03/2022 2340   NITRITE NEGATIVE 07/03/2022 2340   LEUKOCYTESUR NEGATIVE 07/03/2022 2340   Sepsis  Labs: @LABRCNTIP (procalcitonin:4,lacticidven:4)  )No results found for this or any previous visit (from the past 240 hour(s)).    Studies: No results found.  Scheduled Meds:  amLODipine  10 mg Oral Daily   apixaban  10 mg Oral BID   Followed by   Melene Muller ON 07/13/2022] apixaban  5 mg Oral BID   polyethylene glycol  17 g Oral BID   rosuvastatin  20 mg Oral Daily   senna  1 tablet Oral Daily   sodium chloride flush  3 mL Intravenous Q12H    Continuous Infusions:     LOS: 2 days     Briant Cedar, MD [facility]  If 7PM-7AM, please contact night-coverage [email or web address] 07/06/2022, 2:16 PM

## 2022-07-06 NOTE — Discharge Instructions (Signed)
Information on my medicine - ELIQUIS (apixaban)  This medication education was reviewed with me or my healthcare representative as part of my discharge preparation.    Why was Eliquis prescribed for you? Eliquis was prescribed to treat blood clots that may have been found in the veins of your legs (deep vein thrombosis) or in your lungs (pulmonary embolism) and to reduce the risk of them occurring again.  What do You need to know about Eliquis ? The starting dose is 10 mg (two 5 mg tablets) taken TWICE daily for the FIRST SEVEN (7) DAYS, then on 07/13/22 the dose is reduced to ONE 5 mg tablet taken TWICE daily.  Eliquis may be taken with or without food.   Try to take the dose about the same time in the morning and in the evening. If you have difficulty swallowing the tablet whole please discuss with your pharmacist how to take the medication safely.  Take Eliquis exactly as prescribed and DO NOT stop taking Eliquis without talking to the doctor who prescribed the medication.  Stopping may increase your risk of developing a new blood clot.  Refill your prescription before you run out.  After discharge, you should have regular check-up appointments with your healthcare provider that is prescribing your Eliquis.    What do you do if you miss a dose? If a dose of ELIQUIS is not taken at the scheduled time, take it as soon as possible on the same day and twice-daily administration should be resumed. The dose should not be doubled to make up for a missed dose.  Important Safety Information A possible side effect of Eliquis is bleeding. You should call your healthcare provider right away if you experience any of the following: Bleeding from an injury or your nose that does not stop. Unusual colored urine (red or dark brown) or unusual colored stools (red or black). Unusual bruising for unknown reasons. A serious fall or if you hit your head (even if there is no bleeding).  Some medicines  may interact with Eliquis and might increase your risk of bleeding or clotting while on Eliquis. To help avoid this, consult your healthcare provider or pharmacist prior to using any new prescription or non-prescription medications, including herbals, vitamins, non-steroidal anti-inflammatory drugs (NSAIDs) and supplements.  This website has more information on Eliquis (apixaban): http://www.eliquis.com/eliquis/home

## 2022-07-06 NOTE — Progress Notes (Signed)
ANTICOAGULATION CONSULT NOTE - Follow Up Consult  Pharmacy Consult for Heparin Indication: Acute Pulmonary Embolus  No Known Allergies  Patient Measurements: Height: 6\' 4"  (193 cm) Weight: 111.1 kg (245 lb) IBW/kg (Calculated) : 86.8 Heparin Dosing Weight: 109.3  Vital Signs: Temp: 98.6 F (37 C) (06/20 0556) Temp Source: Oral (06/20 0556) BP: 156/72 (06/20 0556) Pulse Rate: 103 (06/20 0556)  Labs: Recent Labs    07/03/22 1959 07/03/22 2255 07/04/22 0523 07/04/22 0911 07/04/22 1433 07/05/22 0542 07/06/22 0548  HGB 14.2  --  13.9  --   --  14.0 13.6  HCT 44.3  --  41.9  --   --  42.4 42.2  PLT 176  --  203  --   --  216 223  HEPARINUNFRC  --   --   --    < > 0.44 0.34 <0.10*  CREATININE 1.20  --  1.33*  --   --  1.69*  --   TROPONINIHS  --  3  --   --   --   --   --    < > = values in this interval not displayed.     Estimated Creatinine Clearance: 61.1 mL/min (A) (by C-G formula based on SCr of 1.69 mg/dL (H)).    Assessment: Patient is a 62 y.o M who presented to the ED on 6/17 with c/o right upper quadrant pain radiating to his back. Pt has no hx of VTE. Chest CT on 6/17 showed findings consistent with acute PE with small clot burden, no evidence for right heart strain.Pt  is not on anticoagulation PTA. LE doppler done on 6/18 showed no evidence of DVT. Echocardiogram was done on 6/19 and was normal.  Heparin drip started on admission for VTE treatment.   Today, 07/05/2022: - Heparin level <0.1- now sub-therapeutic on IV heparin 1800 units/hr - CBC WNL  - No bleeding or infusion interruptions reported by RN -Scr: 1.49  Goal of Therapy:  Heparin level 0.3-0.7 units/ml Monitor platelets by anticoagulation protocol: Yes   Plan:   - Re-bolus IV heparin 2000 units x1 then increase heparin infusion to 2100 units/hr -Recheck heparin level in 8h - Daily heparin levels and CBC  - Continue to monitor H&H and platelets and for signs/symptoms of bleeding - Plan to  transition to DOAC prior to discharge  Junita Push, PharmD  07/06/2022,6:23 AM

## 2022-07-07 DIAGNOSIS — I2699 Other pulmonary embolism without acute cor pulmonale: Secondary | ICD-10-CM | POA: Diagnosis not present

## 2022-07-07 LAB — CBC WITH DIFFERENTIAL/PLATELET
Abs Immature Granulocytes: 0.04 10*3/uL (ref 0.00–0.07)
Basophils Absolute: 0 10*3/uL (ref 0.0–0.1)
Basophils Relative: 0 %
Eosinophils Absolute: 0.2 10*3/uL (ref 0.0–0.5)
Eosinophils Relative: 2 %
HCT: 42.5 % (ref 39.0–52.0)
Hemoglobin: 13.8 g/dL (ref 13.0–17.0)
Immature Granulocytes: 0 %
Lymphocytes Relative: 14 %
Lymphs Abs: 1.3 10*3/uL (ref 0.7–4.0)
MCH: 31.2 pg (ref 26.0–34.0)
MCHC: 32.5 g/dL (ref 30.0–36.0)
MCV: 96.2 fL (ref 80.0–100.0)
Monocytes Absolute: 0.9 10*3/uL (ref 0.1–1.0)
Monocytes Relative: 10 %
Neutro Abs: 6.7 10*3/uL (ref 1.7–7.7)
Neutrophils Relative %: 74 %
Platelets: 228 10*3/uL (ref 150–400)
RBC: 4.42 MIL/uL (ref 4.22–5.81)
RDW: 12.8 % (ref 11.5–15.5)
WBC: 9.2 10*3/uL (ref 4.0–10.5)
nRBC: 0 % (ref 0.0–0.2)

## 2022-07-07 LAB — BASIC METABOLIC PANEL
Anion gap: 5 (ref 5–15)
BUN: 16 mg/dL (ref 8–23)
CO2: 29 mmol/L (ref 22–32)
Calcium: 8.2 mg/dL — ABNORMAL LOW (ref 8.9–10.3)
Chloride: 103 mmol/L (ref 98–111)
Creatinine, Ser: 1.26 mg/dL — ABNORMAL HIGH (ref 0.61–1.24)
GFR, Estimated: >60 mL/min (ref >60)
Glucose, Bld: 113 mg/dL — ABNORMAL HIGH (ref 70–99)
Potassium: 3.5 mmol/L (ref 3.5–5.1)
Sodium: 137 mmol/L (ref 135–145)

## 2022-07-07 MED ORDER — MAGNESIUM CITRATE PO SOLN
1.0000 | Freq: Once | ORAL | Status: AC
  Administered 2022-07-07: 1 via ORAL
  Filled 2022-07-07: qty 296

## 2022-07-07 MED ORDER — SODIUM CHLORIDE 0.9 % IV SOLN
1.0000 g | INTRAVENOUS | Status: DC
  Administered 2022-07-07 – 2022-07-08 (×2): 1 g via INTRAVENOUS
  Filled 2022-07-07 (×2): qty 10

## 2022-07-07 MED ORDER — CEFDINIR 300 MG PO CAPS: 300.0000 mg | ORAL_CAPSULE | Freq: Two times a day (BID) | ORAL | Status: DC

## 2022-07-07 MED ORDER — SODIUM CHLORIDE 0.9 % IV SOLN
500.0000 mg | INTRAVENOUS | Status: DC
  Administered 2022-07-07 – 2022-07-08 (×2): 500 mg via INTRAVENOUS
  Filled 2022-07-07 (×2): qty 5

## 2022-07-07 MED ORDER — ORAL CARE MOUTH RINSE: 15.0000 mL | OROMUCOSAL | Status: DC | PRN

## 2022-07-07 NOTE — Progress Notes (Signed)
PROGRESS NOTE  Wayne Cox:[id number] DOB: 03-31-1960 DOA: 07/03/2022 PCP: Wilfrid Lund, PA  HPI/Recap of past 24 hours: Wayne Cox is a pleasant 62 y.o. male with medical history significant for HTN, HLD and OSA with CPAP intolerance who presents to the ED with worsening right lower chest/RUQ pain and around the right scapula with associated SOB X 2 days. Pt denies any recent surgery, long distance travel (although he reports commuting to work for about an hour to go on an hour to come back),  other prolonged immobilization.  He denies personal history of VTE but states that his mother developed VTE while she was hospitalized, and a nephew also had clots but the circumstances are unclear.  In the ED, vital signs fairly stable, D-dimer 0.61. CTA chest is notable for acute PE with small clot burden, no evidence for right heart strain, and right middle lobe airspace disease that is most likely infarction.  Thin-walled air cysts are also noted in bilateral lungs on CT. Patient was started on IV heparin in the ED and admitted for further management.    Today, patient still reporting right-sided chest pain but does notes improvement.  Due to persistent pain and possibly not taking deep breaths and will cover for CAP.  Reports constipation    Assessment/Plan: Principal Problem:   Pulmonary embolism (HCC) Active Problems:   OSA on CPAP   Hypertension   Multiple idiopathic pulmonary cysts   Acute pulmonary embolism Pulmonary infarction ??Unprovoked Currently on room air, saturating well CTA chest with small clot burden, no right heart strain, noted pulmonary infarction Bilateral Doppler ultrasound negative Echo with a EF of 60 to 65%, no regional wall motion abnormality, grade 1 diastolic dysfunction Switch to DOAC, eliquis Continue pain management  CAP Afebrile, with no leukocytosis Repeat chest x-ray on 6/21 with patchy infiltrates in right lower lung suggesting  atelectasis/pneumonia with interval worsening Possibly coexisting with pulmonary infarction Vs persistent pulmonary infarction Cover with ceftriaxone, azithromycin for now  AKI Likely 2/2 poor oral intake in addition to recent contrast and medication Start IV fluids Hold home Benicar, Aldactone Daily BMP  Leukocytosis Currently afebrile UA unremarkable Likely 2/2 reactive from pulmonary infarction Vs rule out Daily CBC  Hypertension BP stable Continue amlodipine, hold Benicar and Aldactone as above  OSA Not tolerating CPAP Has been referred to ENT to evaluate for inspire device  Cystic lung lesions Nonspecific cystic lesions noted bilaterally, differential diagnosis includes Langerhans cell histiocytosis and less likely lymphangioleiomyomatosis Recommend pulmonary follow-up      Estimated body mass index is 29.82 kg/m as calculated from the following:   Height as of this encounter: 6\' 4"  (1.93 m).   Weight as of this encounter: 111.1 kg.     Code Status: Full  Family Communication: Discussed with wife over the phone on 6/20  Disposition Plan: Status is: Inpatient Remains inpatient appropriate because: Level of care      Consultants: None  Procedures: None  Antimicrobials: Ceftriaxone Azithromycin  DVT prophylaxis: PO Eliquis   Objective: Vitals:   07/06/22 1227 07/06/22 2001 07/07/22 0616 07/07/22 1404  BP: 130/84 (!) 142/69 121/75 133/81  Pulse: 95 96 100 85  Resp: 18 19 16 16   Temp: 98.9 F (37.2 C) 98.7 F (37.1 C) 98.5 F (36.9 C) 98.3 F (36.8 C)  TempSrc: Oral Oral Oral Oral  SpO2: 99% 94% 99% 99%  Weight:      Height:        Intake/Output Summary (Last  24 hours) at 07/07/2022 1813 Last data filed at 07/06/2022 2309 Gross per 24 hour  Intake 591.45 ml  Output --  Net 591.45 ml    Filed Weights   07/03/22 1928  Weight: 111.1 kg    Exam: General: NAD  Cardiovascular: S1, S2 present Respiratory: CTAB Abdomen: Soft,  nontender, nondistended, bowel sounds present Musculoskeletal: No bilateral pedal edema noted Skin: Normal Psychiatry: Normal mood     Data Reviewed: CBC: Recent Labs  Lab 07/03/22 1959 07/04/22 0523 07/05/22 0542 07/06/22 0548 07/07/22 0546  WBC 9.5 11.6* 15.6* 12.8* 9.2  NEUTROABS  --   --   --   --  6.7  HGB 14.2 13.9 14.0 13.6 13.8  HCT 44.3 41.9 42.4 42.2 42.5  MCV 97.6 93.3 95.5 97.0 96.2  PLT 176 203 216 223 228   Basic Metabolic Panel: Recent Labs  Lab 07/03/22 1959 07/04/22 0523 07/05/22 0542 07/06/22 0548 07/07/22 0546  NA 135 136 133* 134* 137  K 4.2 3.7 3.8 3.6 3.5  CL 102 102 96* 101 103  CO2 23 27 28 27 29   GLUCOSE 111* 141* 110* 104* 113*  BUN 15 15 25* 23 16  CREATININE 1.20 1.33* 1.69* 1.49* 1.26*  CALCIUM 8.5* 8.7* 8.6* 8.3* 8.2*   GFR: Estimated Creatinine Clearance: 81.9 mL/min (A) (by C-G formula based on SCr of 1.26 mg/dL (H)). Liver Function Tests: Recent Labs  Lab 07/03/22 1959 07/04/22 0523  AST 26 19  ALT 25 25  ALKPHOS 50 49  BILITOT 1.5* 1.1  PROT 7.6 7.7  ALBUMIN 4.1 4.1   Recent Labs  Lab 07/03/22 1959  LIPASE 32   No results for input(s): "AMMONIA" in the last 168 hours. Coagulation Profile: No results for input(s): "INR", "PROTIME" in the last 168 hours. Cardiac Enzymes: No results for input(s): "CKTOTAL", "CKMB", "CKMBINDEX", "TROPONINI" in the last 168 hours. BNP (last 3 results) No results for input(s): "PROBNP" in the last 8760 hours. HbA1C: No results for input(s): "HGBA1C" in the last 72 hours. CBG: No results for input(s): "GLUCAP" in the last 168 hours. Lipid Profile: No results for input(s): "CHOL", "HDL", "LDLCALC", "TRIG", "CHOLHDL", "LDLDIRECT" in the last 72 hours. Thyroid Function Tests: No results for input(s): "TSH", "T4TOTAL", "FREET4", "T3FREE", "THYROIDAB" in the last 72 hours. Anemia Panel: No results for input(s): "VITAMINB12", "FOLATE", "FERRITIN", "TIBC", "IRON", "RETICCTPCT" in the last  72 hours. Urine analysis:    Component Value Date/Time   COLORURINE YELLOW 07/03/2022 2340   APPEARANCEUR CLEAR 07/03/2022 2340   LABSPEC 1.021 07/03/2022 2340   PHURINE 5.0 07/03/2022 2340   GLUCOSEU NEGATIVE 07/03/2022 2340   HGBUR SMALL (A) 07/03/2022 2340   BILIRUBINUR NEGATIVE 07/03/2022 2340   KETONESUR NEGATIVE 07/03/2022 2340   PROTEINUR NEGATIVE 07/03/2022 2340   NITRITE NEGATIVE 07/03/2022 2340   LEUKOCYTESUR NEGATIVE 07/03/2022 2340   Sepsis Labs: @LABRCNTIP (procalcitonin:4,lacticidven:4)  )No results found for this or any previous visit (from the past 240 hour(s)).    Studies: No results found.  Scheduled Meds:  amLODipine  10 mg Oral Daily   apixaban  10 mg Oral BID   Followed by   Melene Muller ON 07/13/2022] apixaban  5 mg Oral BID   polyethylene glycol  17 g Oral BID   rosuvastatin  20 mg Oral Daily   senna  1 tablet Oral Daily   sodium chloride flush  3 mL Intravenous Q12H    Continuous Infusions:  azithromycin 500 mg (07/07/22 1136)   cefTRIAXone (ROCEPHIN)  IV 1 g (07/07/22  1023)      LOS: 3 days     Briant Cedar, MD [facility]  If 7PM-7AM, please contact night-coverage [email or web address] 07/07/2022, 6:13 PM

## 2022-07-08 DIAGNOSIS — I2699 Other pulmonary embolism without acute cor pulmonale: Secondary | ICD-10-CM | POA: Diagnosis not present

## 2022-07-08 LAB — CBC WITH DIFFERENTIAL/PLATELET
Abs Immature Granulocytes: 0.02 10*3/uL (ref 0.00–0.07)
Basophils Absolute: 0 10*3/uL (ref 0.0–0.1)
Basophils Relative: 0 %
Eosinophils Absolute: 0.5 10*3/uL (ref 0.0–0.5)
Eosinophils Relative: 6 %
HCT: 39.2 % (ref 39.0–52.0)
Hemoglobin: 12.9 g/dL — ABNORMAL LOW (ref 13.0–17.0)
Immature Granulocytes: 0 %
Lymphocytes Relative: 16 %
Lymphs Abs: 1.1 10*3/uL (ref 0.7–4.0)
MCH: 31.1 pg (ref 26.0–34.0)
MCHC: 32.9 g/dL (ref 30.0–36.0)
MCV: 94.5 fL (ref 80.0–100.0)
Monocytes Absolute: 0.8 10*3/uL (ref 0.1–1.0)
Monocytes Relative: 11 %
Neutro Abs: 4.8 10*3/uL (ref 1.7–7.7)
Neutrophils Relative %: 67 %
Platelets: 214 10*3/uL (ref 150–400)
RBC: 4.15 MIL/uL — ABNORMAL LOW (ref 4.22–5.81)
RDW: 12.7 % (ref 11.5–15.5)
WBC: 7.2 10*3/uL (ref 4.0–10.5)
nRBC: 0 % (ref 0.0–0.2)

## 2022-07-08 LAB — BASIC METABOLIC PANEL
Anion gap: 8 (ref 5–15)
BUN: 13 mg/dL (ref 8–23)
CO2: 25 mmol/L (ref 22–32)
Calcium: 8.5 mg/dL — ABNORMAL LOW (ref 8.9–10.3)
Chloride: 102 mmol/L (ref 98–111)
Creatinine, Ser: 1.11 mg/dL (ref 0.61–1.24)
GFR, Estimated: >60 mL/min (ref >60)
Glucose, Bld: 105 mg/dL — ABNORMAL HIGH (ref 70–99)
Potassium: 4.1 mmol/L (ref 3.5–5.1)
Sodium: 135 mmol/L (ref 135–145)

## 2022-07-08 MED ORDER — APIXABAN (ELIQUIS) VTE STARTER PACK (10MG AND 5MG): ORAL_TABLET | ORAL | 0 refills | Status: AC

## 2022-07-08 MED ORDER — CEFDINIR 300 MG PO CAPS: 300.0000 mg | ORAL_CAPSULE | Freq: Two times a day (BID) | ORAL | 0 refills | Status: AC

## 2022-07-08 MED ORDER — AZITHROMYCIN 500 MG PO TABS: 500.0000 mg | ORAL_TABLET | Freq: Every day | ORAL | 0 refills | Status: AC

## 2022-07-08 NOTE — Discharge Summary (Signed)
Physician Discharge Summary   Patient: Wayne Cox MRN: [id number] DOB: 1960-03-02  Admit date:     07/03/2022  Discharge date: 07/08/22  Discharge Physician: Briant Cedar   PCP: Wilfrid Lund, PA   Recommendations at discharge:   Follow-up with PCP in 1 week Pulmonary ambulatory referral made for nonspecific cyst in the lungs  Discharge Diagnoses: Principal Problem:   Pulmonary embolism (HCC) Active Problems:   OSA on CPAP   Hypertension   Multiple idiopathic pulmonary cysts    Hospital Course: Wayne Cox is a pleasant 62 y.o. male with medical history significant for HTN, HLD and OSA with CPAP intolerance who presents to the ED with worsening right lower chest/RUQ pain and around the right scapula with associated SOB X 2 days. Pt denies any recent surgery, long distance travel (although he reports commuting to work for about an hour to go on an hour to come back),  other prolonged immobilization.  He denies personal history of VTE but states that his mother developed VTE while she was hospitalized, and a nephew also had clots but the circumstances are unclear.  In the ED, vital signs fairly stable, D-dimer 0.61. CTA chest is notable for acute PE with small clot burden, no evidence for right heart strain, and right middle lobe airspace disease that is most likely infarction.  Thin-walled air cysts are also noted in bilateral lungs on CT. Patient was started on IV heparin in the ED and admitted for further management.     Today, patient reported significant improvement in right-sided chest pain, denies any other new complaints.  Have been able to ambulate back-and-forth without any issues.  Denies any shortness of breath, abdominal pain, nausea/vomiting, fever/chills.  Patient advised to be compliant with Eliquis and his other medications.  Advised to follow-up with primary care in 1 week and follow-up with pulmonary as discussed.    Assessment and Plan:  Acute  pulmonary embolism Pulmonary infarction ??Unprovoked Currently on room air, saturating well CTA chest with small clot burden, no right heart strain, noted pulmonary infarction Bilateral Doppler ultrasound negative Echo with a EF of 60 to 65%, no regional wall motion abnormality, grade 1 diastolic dysfunction Switch to DOAC, eliquis, advised compliance Follow-up with PCP   ??CAP Afebrile, with no leukocytosis Repeat chest x-ray on 6/21 with patchy infiltrates in right lower lung suggesting atelectasis/pneumonia with interval worsening Possibly coexisting with pulmonary infarction Vs persistent pulmonary infarction S/p ceftriaxone, azithromycin , discharged on p.o. cefdinir to complete 5 days Follow-up with PCP   AKI Likely 2/2 poor oral intake in addition to recent contrast and medication S/p IV fluids Resume Benicar, Aldactone   Leukocytosis Resolved Currently afebrile UA unremarkable Likely 2/2 reactive from pulmonary infarction Vs CAP   Hypertension BP stable Benicar and Aldactone   OSA Not tolerating CPAP Has been referred to ENT to evaluate for inspire device   Cystic lung lesions Nonspecific cystic lesions noted bilaterally, differential diagnosis includes Langerhans cell histiocytosis and less likely lymphangioleiomyomatosis Recommend pulmonary follow-up: Ambulatory referral sent       Consultants: None Procedures performed: None Disposition: Home Diet recommendation:  Cardiac diet   DISCHARGE MEDICATION: Allergies as of 07/08/2022   No Known Allergies      Medication List     STOP taking these medications    ibuprofen 200 MG tablet Commonly known as: ADVIL       TAKE these medications    amLODipine 10 MG tablet Commonly known as: NORVASC Take  10 mg by mouth daily.   Apixaban Starter Pack (10mg  and 5mg ) Commonly known as: ELIQUIS STARTER PACK Take as directed on package: start with two-5mg  tablets twice daily for 7 days. On day 8,  switch to one-5mg  tablet twice daily.   azithromycin 500 MG tablet Commonly known as: Zithromax Take 1 tablet (500 mg total) by mouth daily for 3 days. Start taking on: July 09, 2022   cefdinir 300 MG capsule Commonly known as: OMNICEF Take 1 capsule (300 mg total) by mouth 2 (two) times daily for 3 days.   olmesartan-hydrochlorothiazide 40-25 MG tablet Commonly known as: BENICAR HCT Take 1 tablet by mouth daily.   rosuvastatin 20 MG tablet Commonly known as: CRESTOR Take 20 mg by mouth every evening.   spironolactone 50 MG tablet Commonly known as: ALDACTONE Take 50 mg by mouth daily.   tadalafil 10 MG tablet Commonly known as: CIALIS Take 10 mg by mouth daily as needed for erectile dysfunction.   triamcinolone cream 0.1 % Commonly known as: KENALOG Apply 1 Application topically as needed (rash).   VITAMIN B-12 PO Take 1 capsule by mouth as needed (when feeling sick).        Follow-up Information     Wilfrid Lund, PA. Schedule an appointment as soon as possible for a visit in 1 week(s).   Specialty: Family Medicine Contact information: [address] [place] [address] [phone]                Discharge Exam: Ceasar Mons Weights   07/03/22 1928  Weight: 111.1 kg   General: NAD  Cardiovascular: S1, S2 present Respiratory: CTAB Abdomen: Soft, nontender, nondistended, bowel sounds present Musculoskeletal: No bilateral pedal edema noted Skin: Normal Psychiatry: Normal mood   Condition at discharge: stable  The results of significant diagnostics from this hospitalization (including imaging, microbiology, ancillary and laboratory) are listed below for reference.   Imaging Studies: DG CHEST PORT 1 VIEW  Result Date: 07/06/2022 CLINICAL DATA:  Chest pain, shortness of breath EXAM: PORTABLE CHEST 1 VIEW COMPARISON:  Previous studies including the CT on 07/04/2022 FINDINGS: Cardiac size is within normal limits. Patchy infiltrates are seen in  right lower lung field with interval worsening. There are no signs of alveolar pulmonary edema. There is no significant pleural effusion or pneumothorax. IMPRESSION: There are patchy infiltrates in right lower lung fields suggesting atelectasis/pneumonia with interval worsening. Electronically Signed   By: Ernie Avena M.D.   On: 07/06/2022 15:37   VAS [place] LOWER EXTREMITY VENOUS (DVT)  Result Date: 07/04/2022  Lower Venous DVT Study Patient Name:  Wayne Cox  Date of Exam:   07/04/2022 Medical Rec #: [id number]       Accession #:    [id number] Date of Birth: [birth year]        Patient Gender: M Patient Age:   1 years Exam Location:  [facility] Procedure:      VAS [place] LOWER EXTREMITY VENOUS (DVT) Referring Phys: TIMOTHY OPYD --------------------------------------------------------------------------------  Indications: Pulmonary embolism.  Risk Factors: Confirmed PE. Anticoagulation: Heparin. Comparison Study: No prior studies. Performing Technologist: Chanda Busing RVT  Examination Guidelines: A complete evaluation includes B-mode imaging, spectral Doppler, color Doppler, and power Doppler as needed of all accessible portions of each vessel. Bilateral testing is considered an integral part of a complete examination. Limited examinations for reoccurring indications may be performed as noted. The reflux portion of the exam is performed with the patient in reverse Trendelenburg.  +---------+---------------+---------+-----------+----------+--------------+  RIGHT    CompressibilityPhasicitySpontaneityPropertiesThrombus Aging +---------+---------------+---------+-----------+----------+--------------+ CFV      Full           Yes      Yes                                 +---------+---------------+---------+-----------+----------+--------------+ SFJ      Full                                                         +---------+---------------+---------+-----------+----------+--------------+ FV Prox  Full                                                        +---------+---------------+---------+-----------+----------+--------------+ FV Mid   Full                                                        +---------+---------------+---------+-----------+----------+--------------+ FV DistalFull                                                        +---------+---------------+---------+-----------+----------+--------------+ PFV      Full                                                        +---------+---------------+---------+-----------+----------+--------------+ POP      Full           Yes      Yes                                 +---------+---------------+---------+-----------+----------+--------------+ PTV      Full                                                        +---------+---------------+---------+-----------+----------+--------------+ PERO     Full                                                        +---------+---------------+---------+-----------+----------+--------------+   +---------+---------------+---------+-----------+----------+--------------+ LEFT     CompressibilityPhasicitySpontaneityPropertiesThrombus Aging +---------+---------------+---------+-----------+----------+--------------+ CFV      Full           Yes      Yes                                 +---------+---------------+---------+-----------+----------+--------------+  SFJ      Full                                                        +---------+---------------+---------+-----------+----------+--------------+ FV Prox  Full                                                        +---------+---------------+---------+-----------+----------+--------------+ FV Mid   Full                                                         +---------+---------------+---------+-----------+----------+--------------+ FV DistalFull                                                        +---------+---------------+---------+-----------+----------+--------------+ PFV      Full                                                        +---------+---------------+---------+-----------+----------+--------------+ POP      Full           Yes      Yes                                 +---------+---------------+---------+-----------+----------+--------------+ PTV      Full                                                        +---------+---------------+---------+-----------+----------+--------------+ PERO     Full                                                        +---------+---------------+---------+-----------+----------+--------------+     Summary: RIGHT: - There is no evidence of deep vein thrombosis in the lower extremity.  - No cystic structure found in the popliteal fossa.  LEFT: - There is no evidence of deep vein thrombosis in the lower extremity.  - No cystic structure found in the popliteal fossa.  *See table(s) above for measurements and observations. Electronically signed by Waverly Ferrari MD on 07/04/2022 at 6:14:31 PM.    Final    ECHOCARDIOGRAM COMPLETE  Result Date: 07/04/2022    ECHOCARDIOGRAM REPORT   Patient Name:   Wayne Cox Date of Exam: 07/04/2022 Medical Rec #:  [id number]  Height:       76.0 in Accession #:    [id number]     Weight:       245.0 lb Date of Birth:  Feb 02, 1960       BSA:          2.415 m Patient Age:    61 years       BP:           170/89 mmHg Patient Gender: M              HR:           95 bpm. Exam Location:  Inpatient Procedure: 2D Echo, Cardiac Doppler and Color Doppler Indications:    Pulmonary Embolus  History:        Patient has prior history of Echocardiogram examinations, most                 recent 09/26/2021. Signs/Symptoms:Shortness of Breath; Risk                  Factors:Sleep Apnea and Hypertension.  Sonographer:    Wallie Char Referring Phys: [id number] JENNIFER CHOI  Sonographer Comments: Technically difficult exam due to patient being unable to tolerate laying in the bed. IMPRESSIONS  1. Left ventricular ejection fraction, by estimation, is 60 to 65%. The left ventricle has normal function. The left ventricle has no regional wall motion abnormalities. Left ventricular diastolic parameters are consistent with Grade I diastolic dysfunction (impaired relaxation).  2. Right ventricular systolic function is normal. The right ventricular size is normal. There is mildly elevated pulmonary artery systolic pressure.  3. The mitral valve is normal in structure. No evidence of mitral valve regurgitation. No evidence of mitral stenosis.  4. The aortic valve is normal in structure. Aortic valve regurgitation is not visualized. No aortic stenosis is present.  5. The inferior vena cava is normal in size with greater than 50% respiratory variability, suggesting right atrial pressure of 3 mmHg. Comparison(s): No significant change from prior study. Prior images reviewed side by side. FINDINGS  Left Ventricle: Left ventricular ejection fraction, by estimation, is 60 to 65%. The left ventricle has normal function. The left ventricle has no regional wall motion abnormalities. The left ventricular internal cavity size was normal in size. There is  no left ventricular hypertrophy. Left ventricular diastolic parameters are consistent with Grade I diastolic dysfunction (impaired relaxation). Right Ventricle: The right ventricular size is normal. No increase in right ventricular wall thickness. Right ventricular systolic function is normal. There is mildly elevated pulmonary artery systolic pressure. The tricuspid regurgitant velocity is 3.00  m/s, and with an assumed right atrial pressure of 3 mmHg, the estimated right ventricular systolic pressure is 39.0 mmHg. Left Atrium: Left atrial size  was normal in size. Right Atrium: Right atrial size was normal in size. Pericardium: There is no evidence of pericardial effusion. Mitral Valve: The mitral valve is normal in structure. No evidence of mitral valve regurgitation. No evidence of mitral valve stenosis. MV peak gradient, 4.8 mmHg. The mean mitral valve gradient is 2.0 mmHg. Tricuspid Valve: The tricuspid valve is normal in structure. Tricuspid valve regurgitation is not demonstrated. No evidence of tricuspid stenosis. Aortic Valve: The aortic valve is normal in structure. Aortic valve regurgitation is not visualized. No aortic stenosis is present. Aortic valve mean gradient measures 5.0 mmHg. Aortic valve peak gradient measures 8.6 mmHg. Aortic valve area, by VTI measures 3.27 cm. Pulmonic Valve: The pulmonic valve was grossly normal. Pulmonic valve  regurgitation is not visualized. No evidence of pulmonic stenosis. Aorta: The aortic root and ascending aorta are structurally normal, with no evidence of dilitation. Venous: The inferior vena cava is normal in size with greater than 50% respiratory variability, suggesting right atrial pressure of 3 mmHg. IAS/Shunts: No atrial level shunt detected by color flow Doppler.  LEFT VENTRICLE PLAX 2D LVIDd:         4.50 cm      Diastology LVIDs:         2.90 cm      LV e' medial:    9.74 cm/s LV PW:         1.20 cm      LV E/e' medial:  9.3 LV IVS:        1.20 cm      LV e' lateral:   8.17 cm/s LVOT diam:     2.20 cm      LV E/e' lateral: 11.1 LV SV:         83 LV SV Index:   34 LVOT Area:     3.80 cm  LV Volumes (MOD) LV vol d, MOD A2C: 106.0 ml LV vol d, MOD A4C: 87.3 ml LV vol s, MOD A2C: 32.3 ml LV vol s, MOD A4C: 27.3 ml LV SV MOD A2C:     73.7 ml LV SV MOD A4C:     87.3 ml LV SV MOD BP:      73.1 ml RIGHT VENTRICLE RV Basal diam:  3.50 cm RV S prime:     15.20 cm/s TAPSE (M-mode): 2.2 cm LEFT ATRIUM             Index        RIGHT ATRIUM           Index LA diam:        3.40 cm 1.41 cm/m   RA Area:     12.20  cm LA Vol (A2C):   31.7 ml 13.13 ml/m  RA Volume:   27.00 ml  11.18 ml/m LA Vol (A4C):   32.4 ml 13.42 ml/m LA Biplane Vol: 32.8 ml 13.58 ml/m  AORTIC VALVE AV Area (Vmax):    3.09 cm AV Area (Vmean):   2.98 cm AV Area (VTI):     3.27 cm AV Vmax:           146.50 cm/s AV Vmean:          100.600 cm/s AV VTI:            0.254 m AV Peak Grad:      8.6 mmHg AV Mean Grad:      5.0 mmHg LVOT Vmax:         119.00 cm/s LVOT Vmean:        78.850 cm/s LVOT VTI:          0.218 m LVOT/AV VTI ratio: 0.86  AORTA Ao Root diam: 3.30 cm Ao Asc diam:  2.90 cm MITRAL VALVE               TRICUSPID VALVE MV Area (PHT): 6.71 cm    TR Peak grad:   36.0 mmHg MV Area VTI:   4.06 cm    TR Vmax:        300.00 cm/s MV Peak grad:  4.8 mmHg MV Mean grad:  2.0 mmHg    SHUNTS MV Vmax:       1.09 m/s    Systemic VTI:  0.22 m MV Vmean:  73.6 cm/s   Systemic Diam: 2.20 cm MV Decel Time: 113 msec MV E velocity: 90.40 cm/s MV A velocity: 99.30 cm/s MV E/A ratio:  0.91 Mihai Croitoru MD Electronically signed by Thurmon Fair MD Signature Date/Time: 07/04/2022/4:06:53 PM    Final    CT Angio Chest PE W and/or Wo Contrast  Result Date: 07/04/2022 CLINICAL DATA:  Pulmonary embolism suspected. High probability. Right chest and shoulder pain. Right middle lobe infiltrate on PA and lateral chest yesterday. EXAM: CT ANGIOGRAPHY CHEST WITH CONTRAST TECHNIQUE: Multidetector CT imaging of the chest was performed using the standard protocol during bolus administration of intravenous contrast. Multiplanar CT image reconstructions and MIPs were obtained to evaluate the vascular anatomy. RADIATION DOSE REDUCTION: This exam was performed according to the departmental dose-optimization program which includes automated exposure control, adjustment of the mA and/or kV according to patient size and/or use of iterative reconstruction technique. CONTRAST:  80mL OMNIPAQUE IOHEXOL 350 MG/ML SOLN COMPARISON:  PA Lat chest yesterday, PA Lat chest 12/07/2010  FINDINGS: Cardiovascular: There is a partially occluding thrombus at the main branch division of the interlobar pulmonary artery, extending a short distance into the lateral segmental main artery and continuing with occluding thrombus extending down the medial right middle lobe segmental artery into its subsegmental branch divisions. In the right upper lobe, there is a subsegmental branch point embolus which is nonoccluding, involving an apically directed artery on 4:76 and 14:90 and 91. The overall visualized clot burden is small and there are no findings of acute right heart strain. The cardiac size is normal. The pulmonary arteries are within normal caliber limits as are the pulmonary veins. The aorta and great vessels are normal apart from mild descending aortic tortuosity. No aortic plaques, aneurysm or dissection are seen. The great vessels are clear. There are no visible coronary calcifications and no pericardial effusion. Mediastinum/Nodes: No enlarged mediastinal, hilar, or axillary lymph nodes. Thyroid gland, trachea, and esophagus demonstrate no significant findings. The main bronchi are patent. Lungs/Pleura: There is patchy airspace disease in the right middle lobe, greater medially and most likely related to a pulmonary infarct given the above thrombus location. There is a small layering right pleural effusion with adjacent atelectasis in the posterior right base. Remaining lungs are clear of acute findings. There are multiple thin walled air cysts scattered throughout both lungs. They are more numerous in the upper lobes than elsewhere but are present throughout. The largest air cyst on the right is in the upper lobe base perihilar area and measures 1.8 cm on 2:74. The largest on the left straddles the fissure in the anterior lung base and measures 2.8 cm. Others are smaller and none of them have air-fluid levels in them. There is no pneumothorax. Upper Abdomen: No acute abnormality.  Moderate hepatic  steatosis. Musculoskeletal: Bilateral gynecomastia right-greater-than-left. No chest wall mass. Multilevel thoracic spine bridging enthesopathy and spondylosis. Review of the MIP images confirms the above findings. IMPRESSION: 1. Right-sided pulmonary emboli with small clot burden and no findings of acute right heart strain. See above for details. 2. Right middle lobe airspace disease most likely due to a pulmonary infarct. 3. Small layering right pleural effusion with adjacent atelectasis. 4. Multiple thin walled air cysts scattered throughout both lungs, more numerous in the upper lobes than elsewhere. Differential diagnosis would include Langerhans cell histiocytosis and less likely lymphangioleiomyomatosis. 5. Moderate hepatic steatosis. 6. Bilateral gynecomastia right-greater-than-left. 7. Critical Value/emergent results were called by telephone at the time of interpretation on 07/04/2022 at 12:41  am to provider Vonita Moss , who verbally acknowledged these results. Electronically Signed   By: Almira Bar M.D.   On: 07/04/2022 00:42   US Abdomen Limited RUQ (LIVER/GB)  Result Date: 07/03/2022 CLINICAL DATA:  Right upper quadrant pain EXAM: ULTRASOUND ABDOMEN LIMITED RIGHT UPPER QUADRANT COMPARISON:  None Available. FINDINGS: Gallbladder: No gallstones or wall thickening visualized. No sonographic Murphy sign noted by sonographer. Common bile duct: Diameter: 4 mm Liver: No focal lesion identified. Increase in parenchymal echogenicity. Portal vein is patent on color Doppler imaging with normal direction of blood flow towards the liver. Other: None. IMPRESSION: Hepatic steatosis. Please note limited evaluation for focal hepatic masses in a patient with hepatic steatosis due to decreased penetration of the acoustic ultrasound waves. Electronically Signed   By: Darliss Cheney M.D.   On: 07/03/2022 22:36   DG Chest 2 View  Result Date: 07/03/2022 CLINICAL DATA:  Right lower lobe pain. EXAM: CHEST - 2  VIEW COMPARISON:  Often 02/04/2010 FINDINGS: Hazy airspace opacity in the right lower lobe may be due to atelectasis or pneumonia. No pleural effusion or pneumothorax. Normal cardiomediastinal silhouette. No displaced rib fractures. IMPRESSION: Right lower lung atelectasis or pneumonia. Electronically Signed   By: Minerva Fester M.D.   On: 07/03/2022 22:23    Microbiology: Results for orders placed or performed in visit on 08/22/18  Novel Coronavirus, NAA ([facility])     Status: None   Collection Time: 08/22/18  1:40 PM   Specimen: Oropharyngeal(OP) collection in vial transport medium  Result Value Ref Range Status   SARS-CoV-2, NAA Not Detected Not Detected Final    Comment: This test was developed and its performance characteristics determined by [employer]. This test has not been FDA cleared or approved. This test has been authorized by FDA under an Emergency Use Authorization (EUA). This test is only authorized for the duration of time the declaration that circumstances exist justifying the authorization of the emergency use of in vitro diagnostic tests for detection of SARS-CoV-2 virus and/or diagnosis of COVID-19 infection under section 564(b)(1) of the Act, 21 U.S.C. 151VOH-6(W)(7), unless the authorization is terminated or revoked sooner. When diagnostic testing is negative, the possibility of a false negative result should be considered in the context of a patient's recent exposures and the presence of clinical signs and symptoms consistent with COVID-19. An individual without symptoms of COVID-19 and who is not shedding SARS-CoV-2 virus would expect to have a negative (not detected) result in this assay.     Labs: CBC: Recent Labs  Lab 07/04/22 0523 07/05/22 0542 07/06/22 0548 07/07/22 0546 07/08/22 0613  WBC 11.6* 15.6* 12.8* 9.2 7.2  NEUTROABS  --   --   --  6.7 4.8  HGB 13.9 14.0 13.6 13.8 12.9*  HCT 41.9 42.4 42.2 42.5 39.2  MCV 93.3 95.5 97.0 96.2 94.5   PLT 203 216 223 228 214   Basic Metabolic Panel: Recent Labs  Lab 07/04/22 0523 07/05/22 0542 07/06/22 0548 07/07/22 0546 07/08/22 0613  NA 136 133* 134* 137 135  K 3.7 3.8 3.6 3.5 4.1  CL 102 96* 101 103 102  CO2 27 28 27 29 25   GLUCOSE 141* 110* 104* 113* 105*  BUN 15 25* 23 16 13   CREATININE 1.33* 1.69* 1.49* 1.26* 1.11  CALCIUM 8.7* 8.6* 8.3* 8.2* 8.5*   Liver Function Tests: Recent Labs  Lab 07/03/22 1959 07/04/22 0523  AST 26 19  ALT 25 25  ALKPHOS 50 49  BILITOT 1.5* 1.1  PROT 7.6 7.7  ALBUMIN 4.1 4.1   CBG: No results for input(s): "GLUCAP" in the last 168 hours.  Discharge time spent: greater than 30 minutes.  Signed: Briant Cedar, MD [facility] 07/08/2022

## 2022-07-08 NOTE — TOC Transition Note (Signed)
Transition of Care [facility]) - CM/SW Discharge Note   Patient Details  Name: Wayne Cox MRN: [id number] Date of Birth: Jun 11, 1960  Transition of Care [facility]) CM/SW Contact:  Adrian Prows, RN Phone Number: 07/08/2022, 1:42 PM   Clinical Narrative:    D/C orders received; no TOC needs.   Final next level of care: Home/Self Care Barriers to Discharge: No Barriers Identified   Patient Goals and CMS Choice      Discharge Placement                         Discharge Plan and Services Additional resources added to the After Visit Summary for                                       Social Determinants of Health (SDOH) Interventions SDOH Screenings   Food Insecurity: No Food Insecurity (07/04/2022)  Housing: Low Risk  (07/04/2022)  Transportation Needs: No Transportation Needs (07/04/2022)  Utilities: Not At Risk (07/04/2022)  Tobacco Use: Unknown (07/04/2022)     Readmission Risk Interventions    07/06/2022    9:06 AM 07/04/2022    2:05 PM  Readmission Risk Prevention Plan  Post Dischage Appt Complete   Medication Screening Complete Complete  Transportation Screening Complete Complete

## 2022-07-08 NOTE — Progress Notes (Signed)
Mobility Specialist - Progress Note   07/08/22 1121  Mobility  Activity Ambulated with assistance in hallway  Level of Assistance Independent after set-up  Assistive Device Other (Comment) (IV Pole)  Distance Ambulated (ft) 80 ft  Activity Response Tolerated well  Mobility Referral Yes  $Mobility charge 1 Mobility  Mobility Specialist Start Time (ACUTE ONLY) 1117  Mobility Specialist Stop Time (ACUTE ONLY) 1121  Mobility Specialist Time Calculation (min) (ACUTE ONLY) 4 min   Pt received in recliner and agreeable to mobility. No complaints during session. Pt to recliner after session with all needs met.    [facility]

## 2022-07-11 DIAGNOSIS — R918 Other nonspecific abnormal finding of lung field: Secondary | ICD-10-CM | POA: Diagnosis not present

## 2022-07-11 DIAGNOSIS — I10 Essential (primary) hypertension: Secondary | ICD-10-CM | POA: Diagnosis not present

## 2022-07-11 DIAGNOSIS — K760 Fatty (change of) liver, not elsewhere classified: Secondary | ICD-10-CM | POA: Diagnosis not present

## 2022-07-11 DIAGNOSIS — N179 Acute kidney failure, unspecified: Secondary | ICD-10-CM | POA: Diagnosis not present

## 2022-07-27 DIAGNOSIS — G4733 Obstructive sleep apnea (adult) (pediatric): Secondary | ICD-10-CM | POA: Diagnosis not present

## 2022-07-31 DIAGNOSIS — I119 Hypertensive heart disease without heart failure: Secondary | ICD-10-CM | POA: Diagnosis not present

## 2022-07-31 DIAGNOSIS — Z79899 Other long term (current) drug therapy: Secondary | ICD-10-CM | POA: Diagnosis not present

## 2022-07-31 DIAGNOSIS — D62 Acute posthemorrhagic anemia: Secondary | ICD-10-CM | POA: Diagnosis not present

## 2022-07-31 DIAGNOSIS — R918 Other nonspecific abnormal finding of lung field: Secondary | ICD-10-CM | POA: Diagnosis not present

## 2022-07-31 DIAGNOSIS — I2699 Other pulmonary embolism without acute cor pulmonale: Secondary | ICD-10-CM | POA: Diagnosis not present

## 2022-07-31 DIAGNOSIS — E7800 Pure hypercholesterolemia, unspecified: Secondary | ICD-10-CM | POA: Diagnosis not present

## 2022-08-04 ENCOUNTER — Telehealth: Payer: Self-pay | Admitting: Internal Medicine

## 2022-08-04 NOTE — Telephone Encounter (Signed)
Entered in error

## 2022-08-08 DIAGNOSIS — H43393 Other vitreous opacities, bilateral: Secondary | ICD-10-CM | POA: Diagnosis not present

## 2022-08-09 DIAGNOSIS — I2699 Other pulmonary embolism without acute cor pulmonale: Secondary | ICD-10-CM | POA: Diagnosis not present

## 2022-08-27 DIAGNOSIS — G4733 Obstructive sleep apnea (adult) (pediatric): Secondary | ICD-10-CM | POA: Diagnosis not present

## 2022-09-27 DIAGNOSIS — E7800 Pure hypercholesterolemia, unspecified: Secondary | ICD-10-CM | POA: Diagnosis not present

## 2022-09-27 DIAGNOSIS — I10 Essential (primary) hypertension: Secondary | ICD-10-CM | POA: Diagnosis not present

## 2022-09-27 DIAGNOSIS — Z0000 Encounter for general adult medical examination without abnormal findings: Secondary | ICD-10-CM | POA: Diagnosis not present

## 2022-09-27 DIAGNOSIS — G4733 Obstructive sleep apnea (adult) (pediatric): Secondary | ICD-10-CM | POA: Diagnosis not present

## 2022-09-27 DIAGNOSIS — R7303 Prediabetes: Secondary | ICD-10-CM | POA: Diagnosis not present

## 2022-09-27 DIAGNOSIS — Z23 Encounter for immunization: Secondary | ICD-10-CM | POA: Diagnosis not present

## 2022-09-27 DIAGNOSIS — Z125 Encounter for screening for malignant neoplasm of prostate: Secondary | ICD-10-CM | POA: Diagnosis not present

## 2022-10-07 ENCOUNTER — Encounter (HOSPITAL_COMMUNITY): Payer: Self-pay

## 2022-10-07 ENCOUNTER — Emergency Department (HOSPITAL_COMMUNITY)
Admission: EM | Admit: 2022-10-07 | Discharge: 2022-10-07 | Disposition: A | Discharge status: Home / Self Care | Payer: Federal, State, Local not specified - PPO | Attending: Emergency Medicine | Admitting: Emergency Medicine

## 2022-10-07 DIAGNOSIS — H1132 Conjunctival hemorrhage, left eye: Secondary | ICD-10-CM | POA: Diagnosis not present

## 2022-10-07 DIAGNOSIS — Z79899 Other long term (current) drug therapy: Secondary | ICD-10-CM | POA: Insufficient documentation

## 2022-10-07 DIAGNOSIS — I10 Essential (primary) hypertension: Secondary | ICD-10-CM | POA: Insufficient documentation

## 2022-10-07 DIAGNOSIS — H5789 Other specified disorders of eye and adnexa: Secondary | ICD-10-CM | POA: Diagnosis not present

## 2022-10-07 MED ORDER — TETRACAINE HCL 0.5 % OP SOLN
2.0000 [drp] | Freq: Once | OPHTHALMIC | Status: AC
  Administered 2022-10-07: 2 [drp] via OPHTHALMIC
  Filled 2022-10-07: qty 4

## 2022-10-07 MED ORDER — FLUORESCEIN SODIUM 1 MG OP STRP
1.0000 | ORAL_STRIP | Freq: Once | OPHTHALMIC | Status: AC
  Administered 2022-10-07: 1 via OPHTHALMIC
  Filled 2022-10-07: qty 1

## 2022-10-07 NOTE — ED Notes (Signed)
Visual Acuity Screening   Left eye 20/200  Right eye 20/200  Both eyes 20/200

## 2022-10-07 NOTE — Discharge Instructions (Signed)
Evaluation today revealed that you have a subconjunctival hemorrhage in the left eye.  Please follow-up with your PCP.

## 2022-10-07 NOTE — ED Triage Notes (Signed)
Pt presents with c/o left eye redness that started yesterday. Pt denies any drainage, just reports redness and irritation.

## 2022-10-07 NOTE — ED Provider Notes (Signed)
[place] EMERGENCY DEPARTMENT AT [facility] Provider Note   CSN: [id number] Arrival date & time: 10/07/22  7829     History  Chief Complaint  Patient presents with   Eye Problem   HPI Wayne Cox is a 62 y.o. male with history of hypertension, hypercholesterolemia and prediabetes presenting for eye problem presenting for eye problem.  Started yesterday and is in the left eye.  Noted some redness in his left eye would like his "I was bleeding". Denies any drainage, trauma, pain.  Does report some irritation in that eye.  Denies foreign body sensation.  Denies vision loss and states that eye movement is normal.   Eye Problem      Home Medications Prior to Admission medications   Medication Sig Start Date End Date Taking? Authorizing Provider  amLODipine (NORVASC) 10 MG tablet Take 10 mg by mouth daily. 07/01/22   [provider]  APIXABAN Everlene Balls) VTE STARTER PACK (10MG  AND 5MG ) Take as directed on package: start with two-5mg  tablets twice daily for 7 days. On day 8, switch to one-5mg  tablet twice daily. 07/08/22   Briant Cedar, MD  Cyanocobalamin (VITAMIN B-12 PO) Take 1 capsule by mouth as needed (when feeling sick).    [provider]  olmesartan-hydrochlorothiazide (BENICAR HCT) 40-25 MG tablet Take 1 tablet by mouth daily. 08/17/21   [provider]  rosuvastatin (CRESTOR) 20 MG tablet Take 20 mg by mouth every evening. Patient not taking: Reported on 07/04/2022 12/25/21   [provider]  spironolactone (ALDACTONE) 50 MG tablet Take 50 mg by mouth daily.    [provider]  tadalafil (CIALIS) 10 MG tablet Take 10 mg by mouth daily as needed for erectile dysfunction. Patient not taking: Reported on 07/04/2022    [provider]  triamcinolone cream (KENALOG) 0.1 % Apply 1 Application topically as needed (rash). 07/03/22   [provider]      Allergies    Patient has no known allergies.     Review of Systems   See HPI for pertinent positives  Physical Exam Updated Vital Signs BP (!) 144/87 (BP Location: Right Arm)   Pulse 81   Temp 98.1 F (36.7 C) (Oral)   Resp 16   SpO2 100%  Physical Exam Constitutional:      Appearance: Normal appearance.  HENT:     Head: Normocephalic.     Nose: Nose normal.  Eyes:     General:        Left eye: No foreign body, discharge or hordeolum.     Intraocular pressure: Right eye pressure is 14 mmHg. Left eye pressure is 15 mmHg.     Extraocular Movements: Extraocular movements intact.     Conjunctiva/sclera:     Left eye: Left conjunctiva is injected. Hemorrhage present. No chemosis or exudate.    Pupils:     Left eye: No corneal abrasion or fluorescein uptake. Seidel exam negative. Pulmonary:     Effort: Pulmonary effort is normal.  Neurological:     Mental Status: He is alert.  Psychiatric:        Mood and Affect: Mood normal.     ED Results / Procedures / Treatments   Labs (all labs ordered are listed, but only abnormal results are displayed) Labs Reviewed - No data to display  EKG None  Radiology No results found.  Procedures Procedures    Medications Ordered in ED Medications  tetracaine (PONTOCAINE) 0.5 % ophthalmic solution 2 drop (  has no administration in time range)  fluorescein ophthalmic strip 1 strip (has no administration in time range)    ED Course/ Medical Decision Making/ A&P                                 Medical Decision Making  62 year old well-appearing male presenting for eye problem.  Exam notable for injected and hemorrhage in the left eye.  DDx includes corneal abrasion, foreign body, conjunctivitis, subconjunctival hemorrhage, globe rupture.  Findings are most consistent with subconjunctival hemorrhage otherwise exam was reassuring of his eye.  Advised conservative treatment at home and follow-up with PCP.  Vital stable.  Discharged home in condition.        Final Clinical  Impression(s) / ED Diagnoses Final diagnoses:  Subconjunctival hemorrhage of left eye    Rx / DC Orders ED Discharge Orders     None         Wayne Eagle, PA-C 10/07/22 1610    Wayne Nick, MD 10/09/22 [phone]

## 2022-10-12 DIAGNOSIS — E7800 Pure hypercholesterolemia, unspecified: Secondary | ICD-10-CM | POA: Diagnosis not present

## 2022-10-12 DIAGNOSIS — I119 Hypertensive heart disease without heart failure: Secondary | ICD-10-CM | POA: Diagnosis not present

## 2022-10-12 DIAGNOSIS — R918 Other nonspecific abnormal finding of lung field: Secondary | ICD-10-CM | POA: Diagnosis not present

## 2022-10-12 DIAGNOSIS — I2699 Other pulmonary embolism without acute cor pulmonale: Secondary | ICD-10-CM | POA: Diagnosis not present

## 2022-10-12 DIAGNOSIS — Z79899 Other long term (current) drug therapy: Secondary | ICD-10-CM | POA: Diagnosis not present

## 2022-10-12 DIAGNOSIS — R079 Chest pain, unspecified: Secondary | ICD-10-CM | POA: Diagnosis not present

## 2022-10-31 ENCOUNTER — Encounter: Payer: Self-pay | Admitting: Pulmonary Disease

## 2022-10-31 ENCOUNTER — Ambulatory Visit: Payer: Federal, State, Local not specified - PPO | Admitting: Pulmonary Disease

## 2022-10-31 VITALS — BP 92/60 | HR 85 | Ht 75.0 in | Wt 248.2 lb

## 2022-10-31 DIAGNOSIS — G4733 Obstructive sleep apnea (adult) (pediatric): Secondary | ICD-10-CM | POA: Diagnosis not present

## 2022-10-31 DIAGNOSIS — R9389 Abnormal findings on diagnostic imaging of other specified body structures: Secondary | ICD-10-CM

## 2022-10-31 NOTE — Patient Instructions (Signed)
Will see you back in about 6 months  Follow-up with Dr. Petra Kuba regarding blood work that is done so far   Follow-up with cardiologist, referral to ENT for further management of your sleep apnea  Regular exercises as tolerated  The holes on the lung can be caused by multiple things, more concerning in people who are smokers, may be seen in people with amyloidosis, may be seen in people with myeloma for which more blood work can be done  You can travel from my perspective if you have been on blood thinners and compliant with use  Call us with significant concerns

## 2022-10-31 NOTE — Progress Notes (Signed)
Wayne Cox    [id number]    10-04-1960  Primary Care Physician:Becker, Ann Maki, PA  Referring Physician: Briant Cedar, MD [address] [place],  [place] [address]  Chief complaint:   Being seen for abnormal CT scan of the chest  HPI:  Abnormal CT scan of the chest showing multiple thin-walled cystic changes  Has been following up with Dr. Petra Kuba  Was recently diagnosed with pulmonary embolism for which she remains on anticoagulation  History of obstructive sleep apnea, not tolerating CPAP therapy at present-being followed by cardiology-Dr. Mayford Knife Consideration for referral to ENT for possible inspire device evaluation -He has moderate obstructive sleep apnea, was titrated to CPAP of 13  Denies significant shortness of breath with normal activities  Never smoker  History of prediabetes, hyperlipidemia, hypertension  He works in Electrical engineer   Outpatient Encounter Medications as of 10/31/2022  Medication Sig   amLODipine (NORVASC) 10 MG tablet Take 10 mg by mouth daily.   APIXABAN (ELIQUIS) VTE STARTER PACK (10MG  AND 5MG ) Take as directed on package: start with two-5mg  tablets twice daily for 7 days. On day 8, switch to one-5mg  tablet twice daily.   Cyanocobalamin (VITAMIN B-12 PO) Take 1 capsule by mouth as needed (when feeling sick).   olmesartan-hydrochlorothiazide (BENICAR HCT) 40-25 MG tablet Take 1 tablet by mouth daily.   rosuvastatin (CRESTOR) 20 MG tablet Take 20 mg by mouth every evening. (Patient not taking: Reported on 07/04/2022)   spironolactone (ALDACTONE) 50 MG tablet Take 50 mg by mouth daily.   tadalafil (CIALIS) 10 MG tablet Take 10 mg by mouth daily as needed for erectile dysfunction. (Patient not taking: Reported on 07/04/2022)   triamcinolone cream (KENALOG) 0.1 % Apply 1 Application topically as needed (rash).   No facility-administered encounter medications on file as of 10/31/2022.    Allergies as of  10/31/2022   (No Known Allergies)    Past Medical History:  Diagnosis Date   Abnormal EKG    Dizziness    ED (erectile dysfunction)    Hypercholesterolemia    Hypertension    Insomnia    OSA on CPAP    moderate obstructive sleep apnea with an AHI of 25.7/h with no significant central events.  He underwent CPAP titration to 13 cm H2O.   Prediabetes    Upper back pain     Past Surgical History:  Procedure Laterality Date   BACK SURGERY      Family History  Problem Relation Age of Onset   Cancer Father    Cancer Sister    Hypertension Brother    Diabetes Brother    Cancer Brother    Kidney disease Brother     Social History   Socioeconomic History   Marital status: Married    Spouse name: Not on file   Number of children: Not on file   Years of education: Not on file   Highest education level: Not on file  Occupational History   Not on file  Tobacco Use   Smoking status: Never   Smokeless tobacco: Not on file  Substance and Sexual Activity   Alcohol use: No   Drug use: No   Sexual activity: Not on file  Other Topics Concern   Not on file  Social History Narrative   Not on file   Social Determinants of Health   Financial Resource Strain: Not on file  Food Insecurity: No Food Insecurity (07/04/2022)  Hunger Vital Sign    Worried About Running Out of Food in the Last Year: Never true    Ran Out of Food in the Last Year: Never true  Transportation Needs: No Transportation Needs (07/04/2022)   PRAPARE - Administrator, Civil Service (Medical): No    Lack of Transportation (Non-Medical): No  Physical Activity: Not on file  Stress: Not on file  Social Connections: Not on file  Intimate Partner Violence: Not At Risk (07/04/2022)   Humiliation, Afraid, Rape, and Kick questionnaire    Fear of Current or Ex-Partner: No    Emotionally Abused: No    Physically Abused: No    Sexually Abused: No    Review of Systems  There were no vitals filed for  this visit.   Physical Exam Vitals reviewed.  Constitutional:      Appearance: He is obese.  HENT:     Mouth/Throat:     Mouth: Mucous membranes are moist.  Eyes:     General: No scleral icterus.    Pupils: Pupils are equal, round, and reactive to light.  Cardiovascular:     Rate and Rhythm: Normal rate and regular rhythm.     Heart sounds: No murmur heard.    No friction rub.  Pulmonary:     Effort: No respiratory distress.     Breath sounds: No stridor. No wheezing or rhonchi.  Musculoskeletal:     Cervical back: No rigidity or tenderness.  Neurological:     Mental Status: He is alert.  Psychiatric:        Mood and Affect: Mood normal.      Data Reviewed: CT scan of the chest was reviewed with the patient  Assessment:  History of pulmonary embolism for which patient remains on anticoagulation  History of obstructive sleep apnea -Intolerant of CPAP -Refer to ENT for evaluation for inspire device  Cystic lung disease -Multiple differential including histiocytosis X-usually in smokers, patient is a non-smoker, amyloidosis, Birt-hogg-dube, lymphangioleiomyoma-less likely in men -Recently was seen Dr. Kilpatrick-pulmonologist -He does have an appointment to follow-up in a couple of months -He stated he had blood work done on some other testing  Hypertension -Controlled  Prediabetes -Being followed  Plan/Recommendations: Follow-up in about 6 months  Encouraged to follow-up with Dr. Petra Kuba regarding blood work that tolerated on Zofran  Follow-up with cardiologist, referral to ENT for sleep apnea  Encourage regular exercise as tolerated  Regarding the question of being able to travel, patient may be able to travel, has been on anticoagulation and recommended to continue anticoagulation    Virl Diamond MD [facility] 10/31/2022, 8:34 AM  CC: Briant Cedar, MD

## 2022-12-19 DIAGNOSIS — I2699 Other pulmonary embolism without acute cor pulmonale: Secondary | ICD-10-CM | POA: Diagnosis not present

## 2022-12-19 DIAGNOSIS — I119 Hypertensive heart disease without heart failure: Secondary | ICD-10-CM | POA: Diagnosis not present

## 2022-12-19 DIAGNOSIS — R918 Other nonspecific abnormal finding of lung field: Secondary | ICD-10-CM | POA: Diagnosis not present

## 2022-12-19 DIAGNOSIS — J984 Other disorders of lung: Secondary | ICD-10-CM | POA: Diagnosis not present

## 2022-12-19 DIAGNOSIS — Z79899 Other long term (current) drug therapy: Secondary | ICD-10-CM | POA: Diagnosis not present

## 2022-12-26 DIAGNOSIS — E7800 Pure hypercholesterolemia, unspecified: Secondary | ICD-10-CM | POA: Diagnosis not present

## 2022-12-26 DIAGNOSIS — R944 Abnormal results of kidney function studies: Secondary | ICD-10-CM | POA: Diagnosis not present

## 2023-02-08 DIAGNOSIS — Z79899 Other long term (current) drug therapy: Secondary | ICD-10-CM | POA: Diagnosis not present

## 2023-02-08 DIAGNOSIS — R918 Other nonspecific abnormal finding of lung field: Secondary | ICD-10-CM | POA: Diagnosis not present

## 2023-02-08 DIAGNOSIS — I2699 Other pulmonary embolism without acute cor pulmonale: Secondary | ICD-10-CM | POA: Diagnosis not present

## 2023-02-08 DIAGNOSIS — I119 Hypertensive heart disease without heart failure: Secondary | ICD-10-CM | POA: Diagnosis not present

## 2023-02-13 DIAGNOSIS — R918 Other nonspecific abnormal finding of lung field: Secondary | ICD-10-CM | POA: Diagnosis not present

## 2023-02-13 DIAGNOSIS — J984 Other disorders of lung: Secondary | ICD-10-CM | POA: Diagnosis not present

## 2023-02-13 DIAGNOSIS — I2699 Other pulmonary embolism without acute cor pulmonale: Secondary | ICD-10-CM | POA: Diagnosis not present

## 2023-02-28 DIAGNOSIS — J984 Other disorders of lung: Secondary | ICD-10-CM | POA: Diagnosis not present

## 2023-02-28 DIAGNOSIS — R918 Other nonspecific abnormal finding of lung field: Secondary | ICD-10-CM | POA: Diagnosis not present

## 2023-02-28 DIAGNOSIS — I2699 Other pulmonary embolism without acute cor pulmonale: Secondary | ICD-10-CM | POA: Diagnosis not present

## 2023-03-05 DIAGNOSIS — I2699 Other pulmonary embolism without acute cor pulmonale: Secondary | ICD-10-CM | POA: Diagnosis not present

## 2023-03-05 DIAGNOSIS — Z79899 Other long term (current) drug therapy: Secondary | ICD-10-CM | POA: Diagnosis not present

## 2023-03-05 DIAGNOSIS — I119 Hypertensive heart disease without heart failure: Secondary | ICD-10-CM | POA: Diagnosis not present

## 2023-03-05 DIAGNOSIS — R918 Other nonspecific abnormal finding of lung field: Secondary | ICD-10-CM | POA: Diagnosis not present

## 2023-03-27 DIAGNOSIS — E7800 Pure hypercholesterolemia, unspecified: Secondary | ICD-10-CM | POA: Diagnosis not present

## 2023-03-27 DIAGNOSIS — R7303 Prediabetes: Secondary | ICD-10-CM | POA: Diagnosis not present

## 2023-03-27 DIAGNOSIS — I10 Essential (primary) hypertension: Secondary | ICD-10-CM | POA: Diagnosis not present

## 2023-03-27 DIAGNOSIS — N529 Male erectile dysfunction, unspecified: Secondary | ICD-10-CM | POA: Diagnosis not present

## 2023-03-27 DIAGNOSIS — G4700 Insomnia, unspecified: Secondary | ICD-10-CM | POA: Diagnosis not present

## 2023-05-02 DIAGNOSIS — Z6830 Body mass index (BMI) 30.0-30.9, adult: Secondary | ICD-10-CM | POA: Diagnosis not present

## 2023-05-02 DIAGNOSIS — E6609 Other obesity due to excess calories: Secondary | ICD-10-CM | POA: Diagnosis not present

## 2023-05-02 DIAGNOSIS — R7303 Prediabetes: Secondary | ICD-10-CM | POA: Diagnosis not present

## 2023-05-02 DIAGNOSIS — E7800 Pure hypercholesterolemia, unspecified: Secondary | ICD-10-CM | POA: Diagnosis not present

## 2023-05-17 DIAGNOSIS — I2699 Other pulmonary embolism without acute cor pulmonale: Secondary | ICD-10-CM | POA: Diagnosis not present

## 2023-05-17 DIAGNOSIS — I119 Hypertensive heart disease without heart failure: Secondary | ICD-10-CM | POA: Diagnosis not present

## 2023-05-17 DIAGNOSIS — Z79899 Other long term (current) drug therapy: Secondary | ICD-10-CM | POA: Diagnosis not present

## 2023-05-17 DIAGNOSIS — R918 Other nonspecific abnormal finding of lung field: Secondary | ICD-10-CM | POA: Diagnosis not present

## 2023-05-28 ENCOUNTER — Other Ambulatory Visit (HOSPITAL_COMMUNITY): Payer: Self-pay | Admitting: Pulmonary Disease

## 2023-05-28 DIAGNOSIS — I2699 Other pulmonary embolism without acute cor pulmonale: Secondary | ICD-10-CM

## 2023-05-30 NOTE — Progress Notes (Unsigned)
 ENT CONSULT:  Reason for Consult: Inspire Consult   Ref: Starr Eddy  and PA Harrie Limb  HPI: Discussed the use of AI scribe software for clinical note transcription with the patient, who gave verbal consent to proceed.  History of Present Illness Wayne Cox is a 63 year old male with hx of severe sleep apnea who presents for evaluation of CPAP intolerance and consideration of alternative treatments.  He was diagnosed with sleep apnea in 2023 following a split night sleep study. He experiences poor sleep, sleeping only two to three hours at a time and feeling unrested. 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 Despite attempts to use CPAP therapy, he cannot tolerate it due to feelings of claustrophobia and issues with the mask dislodging during sleep. He is not currently using CPAP at all.  He has a history of a pulmonary embolism, which occurred in June or July of the previous year, preventing him from flying for six months. He was on Eliquis  until January of this year. He denies any history of stroke or heart attack, although he mentions a past incident of blacking out while walking down stairs. He is scheduled for an MRI on the 22nd of this month to further investigate the cause of the clot. Unsure if saw Cardiology in the past. He has hx of pulmonary cysts, has an MRI coming up. No hx of smoking or alcohol use in the past.    He has no history of insomnia requiring medication, but he describes difficulty falling asleep, often staying awake until late at night and waking up early, leading to significant fatigue during the day.  He has requested a hearing test due to concerns about his hearing. No ringing in the ears.   Records Reviewed:  D/c summary 07/08/22 63 y.o. male with medical history significant for HTN, HLD and OSA with CPAP intolerance who presents to the ED with worsening right lower chest/RUQ pain and  around the right scapula with associated SOB X 2 days. Pt denies any recent surgery, long distance travel (although he reports commuting to work for about an hour to go on an hour to come back),  other prolonged immobilization.  He denies personal history of VTE but states that his mother developed VTE while she was hospitalized, and a nephew also had clots but the circumstances are unclear.  In the ED, vital signs fairly stable, D-dimer 0.61. CTA chest is notable for acute PE with small clot burden, no evidence for right heart strain, and right middle lobe airspace disease that is most likely infarction.  Thin-walled air cysts are also noted in bilateral lungs on CT. Patient was started on IV heparin  in the ED and admitted for further management.     Past Medical History:  Diagnosis Date   Abnormal EKG    Dizziness    ED (erectile dysfunction)    Hypercholesterolemia    Hypertension    Insomnia    OSA on CPAP    moderate obstructive sleep apnea with an AHI of 25.7/h with no significant central events.  He underwent CPAP titration to 13 cm H2O.   Prediabetes    Upper back pain     Past Surgical History:  Procedure Laterality Date   BACK SURGERY      Family History  Problem Relation Age of Onset   Cancer Father    Cancer Sister    Hypertension Brother  Diabetes Brother    Cancer Brother    Kidney disease Brother     Social History:  reports that he has never smoked. He does not have any smokeless tobacco history on file. He reports that he does not drink alcohol and does not use drugs.  Allergies: No Known Allergies  Medications: I have reviewed the patient's current medications.  The PMH, PSH, Medications, Allergies, and SH were reviewed and updated.  ROS: Constitutional: Negative for fever, weight loss and weight gain. Cardiovascular: Negative for chest pain and dyspnea on exertion. Respiratory: Is not experiencing shortness of breath at rest. Gastrointestinal: Negative  for nausea and vomiting. Neurological: Negative for headaches. Psychiatric: The patient is not nervous/anxious  Blood pressure 109/71, pulse 73, SpO2 94%. There is no height or weight on file to calculate BMI.  BMI   PHYSICAL EXAM:  Exam: General: Well-developed, well-nourished Respiratory Respiratory effort: Equal inspiration and expiration without stridor Cardiovascular Peripheral Vascular: Warm extremities with equal color/perfusion Eyes: No nystagmus with equal extraocular motion bilaterally Neuro/Psych/Balance: Patient oriented to person, place, and time; Appropriate mood and affect; Gait is intact with no imbalance; Cranial nerves I-XII are intact Head and Face Inspection: Normocephalic and atraumatic without mass or lesion Palpation: Facial skeleton intact without bony stepoffs Salivary Glands: No mass or tenderness Facial Strength: Facial motility symmetric and full bilaterally ENT Pinna: External ear intact and fully developed External canal: Canal is patent with intact skin Tympanic Membrane: Clear and mobile External Nose: No scar or anatomic deformity Internal Nose: Septum is S-shaped. No polyp, or purulence. Mucosal edema and erythema present.  Bilateral inferior turbinate hypertrophy.  Lips, Teeth, and gums: Mucosa and teeth intact and viable TMJ: No pain to palpation with full mobility Oral cavity/oropharynx: No erythema or exudate, no lesions present Friedman III tongue position, no tonsils  Prominent base of the tongue  Nasopharynx: No mass or lesion with intact mucosa Hypopharynx: Intact mucosa without pooling of secretions Larynx Glottic: Full true vocal cord mobility without lesion or mass Supraglottic: Normal appearing epiglottis and AE folds Interarytenoid Space: Moderate pachydermia&edema Subglottic Space: Patent without lesion or edema Neck Neck and Trachea: Midline trachea without mass or lesion Thyroid: No mass or nodularity Lymphatics: No  lymphadenopathy  Procedure: Preoperative diagnosis: OSA and CPAP intolerance   Postoperative diagnosis:   Same + GERD LPR septal deviation and ITH  Procedure: Flexible fiberoptic laryngoscopy  Surgeon: Artice Last, MD  Anesthesia: Topical lidocaine and Afrin Complications: None Condition is stable throughout exam  Indications and consent:  The patient presents to the clinic with above symptoms. Indirect laryngoscopy view was incomplete. Thus it was recommended that they undergo a flexible fiberoptic laryngoscopy. All of the risks, benefits, and potential complications were reviewed with the patient preoperatively and verbal informed consent was obtained.  Procedure: The patient was seated upright in the clinic. Topical lidocaine and Afrin were applied to the nasal cavity. After adequate anesthesia had occurred, I then proceeded to pass the flexible telescope into the nasal cavity. The nasal cavity was patent without rhinorrhea or polyp. The nasopharynx was also patent without mass or lesion. The base of tongue was visualized and was normal. There were no signs of pooling of secretions in the piriform sinuses. The true vocal folds were mobile bilaterally. There were no signs of glottic or supraglottic mucosal lesion or mass. There was moderate interarytenoid pachydermia and post cricoid edema. The telescope was then slowly withdrawn and the patient tolerated the procedure throughout.   Studies Reviewed: Split night  sleep study 11/29/21 IMPRESSIONS - Moderate obstructive sleep apnea occurred during the diagnostic portion of the study (AHI = 25.7/hour). An optimal PAP pressure was selected for this patient ( 13 cm of water) - No significant central sleep apnea occurred during the diagnostic portion of the study (CAI = 0.9/hour). - The patient had minimal or no oxygen desaturation during the diagnostic portion of the study (Min O2 = 82.0%) - The patient snored with loud snoring volume  during the diagnostic portion of the study. - No cardiac abnormalities were noted during this study. - Clinically significant periodic limb movements did not occur during sleep. BMI 29  Assessment/Plan: Encounter Diagnoses  Name Primary?   Decreased hearing of both ears    OSA (obstructive sleep apnea) Yes   Intolerance of continuous positive airway pressure (CPAP) ventilation    Nasal septal deviation    Hypertrophy of both inferior nasal turbinates    Environmental and seasonal allergies    Post-nasal drip     Assessment and Plan Assessment & Plan Obstructive Sleep Apnea, Severe, CPAP intolerance  Severe obstructive sleep apnea with CPAP non-compliance. Discussed Inspire implant as alternative treatment. Suitable BMI for procedure. No central apneas on sleep study in 2023. Explained drug-induced sleep endoscopy necessity for soft palate collapse assessment. Informed about procedure details, success rate, and recovery.  OSA, severe, without multilevel collapse, with failure to tolerate PAP therapy and/or more conservative measures. Presence of smaller/absent tonsils and larger tongue position (Friedman tongue position or modified Mallampati) suggests that hypopharyngeal/retrolingual collapse is contributing to the patient's OSA. Reather Campbell, M et al. Staging of obstructive sleep apnea/hypopnea syndrome: a guide to appropriate treatment. Laryngoscope, 2004 Mar, 114(3):454-9. PMID: [id number]) Options including positional therapy, weight loss, oral appliances, PAP and surgical correction discussed. Pt is not ideal candidate for oral appliance due to severity of OSA Pt could be a candidate for Hypoglossal nerve stimulation (Inspire therapy) pending DISE results    - schedule for drug-induced sleep endoscopy. - Refer to sleep medicine for post-implant management and insomnia evaluation. - Provided Inspire implant brochure and web link. - Schedule Inspire implant procedure upon insurance  approval. Might require cardiac clearance 2/2 hx of DVT and syncope   Pulmonary Embolism History of pulmonary embolism treated with Eliquis  until January 2025. No current anticoagulation therapy. No stroke or myocardial infarction history.  Decreased hearing  - schedule audiogram   Follow-up Follow-up includes scheduling of procedures and referrals. - Schedule hearing test. - Schedule drug-induced sleep endoscopy. - Referral to [facility] for sleep medicine consultation.   Thank you for allowing me to participate in the care of this patient. Please do not hesitate to contact me with any questions or concerns.   Artice Last, MD [facility]ol[facility]ology [facility] Phone: [phone] Fax: [phone]    05/31/2023, 1:47 PM

## 2023-05-31 ENCOUNTER — Ambulatory Visit (INDEPENDENT_AMBULATORY_CARE_PROVIDER_SITE_OTHER): Admitting: Otolaryngology

## 2023-05-31 ENCOUNTER — Encounter (INDEPENDENT_AMBULATORY_CARE_PROVIDER_SITE_OTHER): Payer: Self-pay | Admitting: Otolaryngology

## 2023-05-31 VITALS — BP 109/71 | HR 73

## 2023-05-31 DIAGNOSIS — J3089 Other allergic rhinitis: Secondary | ICD-10-CM

## 2023-05-31 DIAGNOSIS — J343 Hypertrophy of nasal turbinates: Secondary | ICD-10-CM

## 2023-05-31 DIAGNOSIS — G4733 Obstructive sleep apnea (adult) (pediatric): Secondary | ICD-10-CM

## 2023-05-31 DIAGNOSIS — I2699 Other pulmonary embolism without acute cor pulmonale: Secondary | ICD-10-CM | POA: Diagnosis not present

## 2023-05-31 DIAGNOSIS — H9190 Unspecified hearing loss, unspecified ear: Secondary | ICD-10-CM

## 2023-05-31 DIAGNOSIS — Z789 Other specified health status: Secondary | ICD-10-CM

## 2023-05-31 DIAGNOSIS — Z91198 Patient's noncompliance with other medical treatment and regimen for other reason: Secondary | ICD-10-CM

## 2023-05-31 DIAGNOSIS — J342 Deviated nasal septum: Secondary | ICD-10-CM

## 2023-05-31 DIAGNOSIS — H9193 Unspecified hearing loss, bilateral: Secondary | ICD-10-CM

## 2023-05-31 DIAGNOSIS — R918 Other nonspecific abnormal finding of lung field: Secondary | ICD-10-CM | POA: Diagnosis not present

## 2023-05-31 DIAGNOSIS — R0982 Postnasal drip: Secondary | ICD-10-CM

## 2023-05-31 DIAGNOSIS — J984 Other disorders of lung: Secondary | ICD-10-CM | POA: Diagnosis not present

## 2023-05-31 NOTE — Patient Instructions (Addendum)
 It was very nice to meet you today,   Please see the following link for the [facility] program   [email or web address]  This website has information about how you can connect to other patients who have had Inspire Implant procedure  POST-OPERATIVE INSTRUCTIONS:   Please restart all of your home medications if you take anything on a daily basis.  You can resume regular diet after this procedure.   DIET: Resume normal diet HYGIENE: Please wait until 48 hours after surgery before getting incisions on neck, chest, and torso wet. In the first 48 hours after surgery, will likely need to take sponge baths. WOUND CARE: Please leave pressure dressing on for 48 hours after surgery. Gently place antibiotic ointment over incisions 2 times per day; use clean q-tip. May place a clean bandage over incisions as needed. After 48 hours, you may get incisions wet with warm soap and water, but do not soak the incisions.  Pat area dry gently.  Immediately place antibiotic ointment. Take oral antibiotics as prescribed If skin around incision starts to get red (> 1cm), swollen, and/or more painful, please call the office ACTIVITY: Try to avoid sleeping on the side of your surgery, to the extent possible.   You may walk for exercise starting the day after surgery. For 2 weeks: Do not pick up anything greater than 5 pounds with the hand/arm that's on the same side as the surgery.  After 2 weeks, you may increase weight to 10 pounds.   Consider performing neck rolls 10 clockwise and 10 counterclockwise 3x/day. For 4 weeks, no strenuous activity (running, jogging, lifting weights, gardening, sports) or until cleared by physician.   PAIN MEDICATIONS: You will be prescribed Oxycodone for pain.   Take Tylenol and Motrin extra strength every 6 hrs staggered 3 hrs apart from each other Avoid aspirin for 7 days after surgery Avoid direct heat (such as heating pads) to incision  sites.   May gently place ice over surgery sites as needed.  Please place a thin clean towel over skin first and then place ice bag over towel.  Ice for 10 minutes at a time only.  POST-OPERATIVE CLINIC APPOINTMENTS: 1 week: suture removal and wound check in the office.  1 month: device activation and wound check in the office. 2.5 months: check in visit to assess usage. 3-4 months: titration sleep study based on usage of >4 hrs/night.  4 months: final wound check in the office.  Yearly: device check at office.  SCAR CARE: After incisions have healed, you will have a scar, which will continue to evolve over the course of 12 months.  Caring for your incision scars will help them to be as minimal as possible. If you are out in the sun with incision exposure, please remember to place sunscreen over the incision and surrounding skin.   You may use vitamin E or "Scar ointment/cream" to help soften scar.  Please wait one month after surgery before starting this.

## 2023-06-01 ENCOUNTER — Encounter (INDEPENDENT_AMBULATORY_CARE_PROVIDER_SITE_OTHER): Payer: Self-pay

## 2023-06-06 DIAGNOSIS — R7303 Prediabetes: Secondary | ICD-10-CM | POA: Diagnosis not present

## 2023-06-06 DIAGNOSIS — E7800 Pure hypercholesterolemia, unspecified: Secondary | ICD-10-CM | POA: Diagnosis not present

## 2023-06-06 DIAGNOSIS — Z6830 Body mass index (BMI) 30.0-30.9, adult: Secondary | ICD-10-CM | POA: Diagnosis not present

## 2023-06-06 DIAGNOSIS — E6609 Other obesity due to excess calories: Secondary | ICD-10-CM | POA: Diagnosis not present

## 2023-06-06 HISTORY — DX: Prediabetes: R73.03

## 2023-06-07 ENCOUNTER — Ambulatory Visit (HOSPITAL_COMMUNITY)
Admission: RE | Admit: 2023-06-07 | Discharge: 2023-06-07 | Disposition: A | Discharge status: Home / Self Care | Source: Ambulatory Visit | Attending: Pulmonary Disease | Admitting: Pulmonary Disease

## 2023-06-07 DIAGNOSIS — K760 Fatty (change of) liver, not elsewhere classified: Secondary | ICD-10-CM | POA: Diagnosis not present

## 2023-06-07 DIAGNOSIS — I2699 Other pulmonary embolism without acute cor pulmonale: Secondary | ICD-10-CM | POA: Diagnosis not present

## 2023-06-07 DIAGNOSIS — R918 Other nonspecific abnormal finding of lung field: Secondary | ICD-10-CM | POA: Diagnosis not present

## 2023-06-07 MED ORDER — SODIUM CHLORIDE (PF) 0.9 % IJ SOLN
INTRAMUSCULAR | Status: AC
  Filled 2023-06-07: qty 50

## 2023-06-07 MED ORDER — IOHEXOL 350 MG/ML SOLN
75.0000 mL | Freq: Once | INTRAVENOUS | Status: AC | PRN
  Administered 2023-06-07: 75 mL via INTRAVENOUS

## 2023-06-20 ENCOUNTER — Ambulatory Visit (INDEPENDENT_AMBULATORY_CARE_PROVIDER_SITE_OTHER): Admitting: Audiology

## 2023-06-20 DIAGNOSIS — Z0110 Encounter for examination of ears and hearing without abnormal findings: Secondary | ICD-10-CM | POA: Diagnosis not present

## 2023-06-20 NOTE — Progress Notes (Signed)
  [address] [place], [place] [address] [phone]  Audiological Evaluation    Name: Wayne Cox     DOB:   07/11/1960      MRN:   [id number]                                                                                     Service Date: 06/20/2023     Accompanied by: unaccompanied   Patient comes today after Dr. Soldatova, ENT sent a referral for a hearing evaluation due to concerns with hearing loss.   Symptoms Yes Details  Hearing loss  [x]  Difficulty hearing when in the office - suing the phone. Maybe one ear is better than the other, not sure of which one.  Tinnitus  []    Ear pain/ infections/pressure  []    Balance problems  []  Off balance when bending over - not too much . Patient will follow up with his physician if it becomes a concern for him.  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    Otoscopy: Right ear: Clear external ear canals and notable landmarks visualized on the tympanic membrane. Left ear:  Clear external ear canals and notable landmarks visualized on the tympanic membrane.  Tympanometry: Right ear: Type A- Normal external ear canal volume with normal middle ear pressure and tympanic membrane compliance. Left ear: Type A- Normal external ear canal volume with normal middle ear pressure and tympanic membrane compliance.    Pure tone Audiometry: Right ear- Normal hearing from [id number] Hz.  Left ear-  Normal to borderline normal hearing from [id number] Hz.  Slight asymmetry noted, worse in the left ear.  Speech Audiometry: Right ear- Speech Reception Threshold (SRT) was obtained at 10 dBHL. Left ear-Speech Reception Threshold (SRT) was obtained at 20 dBHL.   Word Recognition Score Tested using NU-6 (MLV) Right ear: 100% was obtained at a presentation level of 55 dBHL with contralateral masking which is deemed as   excellent. Left ear: 100% was obtained at a presentation level of 55 dBHL with contralateral masking which is deemed as  excellent.   The hearing test results were completed under headphones and re-checked with inserts and results are deemed to be of good reliability. Test technique:  conventional      Recommendations: Follow up with ENT as needed.   Return for a hearing evaluation in one year, before if concerns with hearing changes arise or per MD recommendation.   Chandler Swiderski MARIE LEROUX-MARTINEZ, AUD

## 2023-06-25 ENCOUNTER — Encounter: Payer: Self-pay | Admitting: Audiology

## 2023-06-27 DIAGNOSIS — G4752 REM sleep behavior disorder: Secondary | ICD-10-CM | POA: Diagnosis not present

## 2023-06-27 DIAGNOSIS — F5104 Psychophysiologic insomnia: Secondary | ICD-10-CM | POA: Diagnosis not present

## 2023-06-27 DIAGNOSIS — G4733 Obstructive sleep apnea (adult) (pediatric): Secondary | ICD-10-CM | POA: Diagnosis not present

## 2023-06-27 DIAGNOSIS — F515 Nightmare disorder: Secondary | ICD-10-CM | POA: Diagnosis not present

## 2023-07-10 ENCOUNTER — Encounter (HOSPITAL_COMMUNITY): Payer: Self-pay | Admitting: Physician Assistant

## 2023-07-10 ENCOUNTER — Other Ambulatory Visit: Payer: Self-pay

## 2023-07-10 NOTE — Progress Notes (Signed)
 PCP - Dr Alberta Sharps  Cardiologist - none  CT Chest x-ray - 06/07/23 EKG - DOS Stress Test - n/a ECHO - 07/04/22 Cardiac Cath - n/a  ICD Pacemaker/Loop - n/a  Sleep Study -  Yes (11/2021) CPAP - does not use CPAP  Diabetes - n/a  Blood Thinner Instructions:  Patient states not taking Eliquis  per MD recommendations.  Aspirin Instructions: none  NPO  Anesthesia review: Yes  STOP now taking any Aspirin (unless otherwise instructed by your surgeon), Aleve, Naproxen, Ibuprofen, Motrin, Advil, Goody's, BC's, all herbal medications, fish oil, and all vitamins.   Coronavirus Screening Do you have any of the following symptoms:  Cough yes/no: No Fever (>100.84F)  yes/no: No Runny nose yes/no: No Sore throat yes/no: No Difficulty breathing/shortness of breath  yes/no: No  Have you traveled in the last 14 days and where? yes/no: No  Patient verbalized understanding of instructions that were given via phone.

## 2023-07-10 NOTE — Anesthesia Preprocedure Evaluation (Signed)
 Anesthesia Evaluation  Patient identified by MRN, date of birth, ID band Patient awake    Reviewed: Allergy & Precautions, NPO status , Patient's Chart, lab work & pertinent test results  Airway        Dental   Pulmonary sleep apnea , PE (06/2022)          Cardiovascular hypertension (amlodipine , olmesartan -HCTZ, spironolactone ), Pt. on medications   HLD  TTE 07/04/2022: IMPRESSIONS    1. Left ventricular ejection fraction, by estimation, is 60 to 65%. The  left ventricle has normal function. The left ventricle has no regional  wall motion abnormalities. Left ventricular diastolic parameters are  consistent with Grade I diastolic  dysfunction (impaired relaxation).   2. Right ventricular systolic function is normal. The right ventricular  size is normal. There is mildly elevated pulmonary artery systolic  pressure.   3. The mitral valve is normal in structure. No evidence of mitral valve  regurgitation. No evidence of mitral stenosis.   4. The aortic valve is normal in structure. Aortic valve regurgitation is  not visualized. No aortic stenosis is present.   5. The inferior vena cava is normal in size with greater than 50%  respiratory variability, suggesting right atrial pressure of 3 mmHg     Neuro/Psych    GI/Hepatic   Endo/Other  Pre-diabetes  Renal/GU      Musculoskeletal   Abdominal   Peds  Hematology   Anesthesia Other Findings Stopped Eliquis  in January  Reproductive/Obstetrics                             Anesthesia Physical Anesthesia Plan  ASA: 3  Anesthesia Plan: MAC   Post-op Pain Management: Minimal or no pain anticipated   Induction: Intravenous  PONV Risk Score and Plan: 1 and Propofol infusion, TIVA and Treatment may vary due to age or medical condition  Airway Management Planned: Natural Airway and Nasal Cannula  Additional Equipment:   Intra-op Plan:    Post-operative Plan:   Informed Consent:      Dental advisory given  Plan Discussed with: CRNA and Anesthesiologist  Anesthesia Plan Comments: (Discussed with patient risks of MAC including, but not limited to, minor pain or discomfort, hearing people in the room, and possible need for backup general anesthesia. Risks for general anesthesia also discussed including, but not limited to, sore throat, hoarse voice, chipped/damaged teeth, injury to vocal cords, nausea and vomiting, allergic reactions, lung infection, heart attack, stroke, and death. All questions answered. )       Anesthesia Quick Evaluation

## 2023-07-11 ENCOUNTER — Encounter (HOSPITAL_COMMUNITY): Admission: RE | Payer: Self-pay | Source: Home / Self Care

## 2023-07-11 ENCOUNTER — Ambulatory Visit (HOSPITAL_COMMUNITY): Admission: RE | Admit: 2023-07-11 | Source: Home / Self Care

## 2023-07-11 ENCOUNTER — Telehealth (INDEPENDENT_AMBULATORY_CARE_PROVIDER_SITE_OTHER): Payer: Self-pay

## 2023-07-11 ENCOUNTER — Encounter (HOSPITAL_COMMUNITY): Payer: Self-pay | Admitting: Physician Assistant

## 2023-07-11 ENCOUNTER — Telehealth (INDEPENDENT_AMBULATORY_CARE_PROVIDER_SITE_OTHER): Payer: Self-pay | Admitting: Otolaryngology

## 2023-07-11 HISTORY — DX: Other pulmonary embolism without acute cor pulmonale: I26.99

## 2023-07-11 SURGERY — DRUG INDUCED SLEEP ENDOSCOPY
Anesthesia: Choice

## 2023-07-11 NOTE — Telephone Encounter (Signed)
 Patient called in and stated wife had questions regarding surgery that they would like answered before rescheduling surgery.  [phone]

## 2023-07-11 NOTE — Telephone Encounter (Signed)
 Patient's wife called concerned about the pt's upcoming inspire surgery that was originally scheduled for today 6/25. She wanted to know what the percentage of success of the inspire implant, she wanted to know if it would actually help the pt's condition. She also wanted to know if for some reason the inspire implant does not work, can it be taken out and will there be additional charges for that procedure. She also wanted to know any possible side effects of this surgery. (Because of all of these questions I just got the pt scheduled for a pre-op) Please advise.

## 2023-08-02 DIAGNOSIS — I2699 Other pulmonary embolism without acute cor pulmonale: Secondary | ICD-10-CM | POA: Diagnosis not present

## 2023-08-02 DIAGNOSIS — Z79899 Other long term (current) drug therapy: Secondary | ICD-10-CM | POA: Diagnosis not present

## 2023-08-02 DIAGNOSIS — R918 Other nonspecific abnormal finding of lung field: Secondary | ICD-10-CM | POA: Diagnosis not present

## 2023-08-02 DIAGNOSIS — I119 Hypertensive heart disease without heart failure: Secondary | ICD-10-CM | POA: Diagnosis not present

## 2023-08-16 ENCOUNTER — Encounter (INDEPENDENT_AMBULATORY_CARE_PROVIDER_SITE_OTHER): Payer: Self-pay | Admitting: Otolaryngology

## 2023-08-16 ENCOUNTER — Ambulatory Visit (INDEPENDENT_AMBULATORY_CARE_PROVIDER_SITE_OTHER): Admitting: Otolaryngology

## 2023-08-16 VITALS — BP 104/67 | HR 89

## 2023-08-16 DIAGNOSIS — G4733 Obstructive sleep apnea (adult) (pediatric): Secondary | ICD-10-CM | POA: Diagnosis not present

## 2023-08-16 DIAGNOSIS — Z789 Other specified health status: Secondary | ICD-10-CM

## 2023-08-16 DIAGNOSIS — Z91198 Patient's noncompliance with other medical treatment and regimen for other reason: Secondary | ICD-10-CM

## 2023-08-16 DIAGNOSIS — Z0110 Encounter for examination of ears and hearing without abnormal findings: Secondary | ICD-10-CM | POA: Diagnosis not present

## 2023-08-16 DIAGNOSIS — J342 Deviated nasal septum: Secondary | ICD-10-CM

## 2023-08-16 DIAGNOSIS — J343 Hypertrophy of nasal turbinates: Secondary | ICD-10-CM

## 2023-08-16 DIAGNOSIS — R0982 Postnasal drip: Secondary | ICD-10-CM

## 2023-08-16 DIAGNOSIS — J3089 Other allergic rhinitis: Secondary | ICD-10-CM

## 2023-08-16 NOTE — Patient Instructions (Signed)
  It was very nice to meet you today,   Please see the following link for the [facility] program   [email or web address]  This website has information about how you can connect to other patients who have had Inspire Implant procedure

## 2023-08-16 NOTE — Progress Notes (Signed)
 ENT Progress Note:   Update 08/16/2023  Discussed the use of AI scribe software for clinical note transcription with the patient, who gave verbal consent to proceed.  History of Present Illness He cancelled drug-induced sleep endoscopy. He was seen by [facility] 06/2023 but has not had a repeat sleep study. He had some questions about Inspire implant. He also had normal hearing here 6/4/205.   Records Reviewed:  Initial Evaluation  Reason for Consult: Inspire Consult   Ref: Randine Bihari  and PA Wonda Bart  HPI: Discussed the use of AI scribe software for clinical note transcription with the patient, who gave verbal consent to proceed.  History of Present Illness Wayne Cox is a 63 year old male with hx of severe sleep apnea who presents for evaluation of CPAP intolerance and consideration of alternative treatments.  He was diagnosed with sleep apnea in 2023 following a split night sleep study. He experiences poor sleep, sleeping only two to three hours at a time and feeling unrested. 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 Despite attempts to use CPAP therapy, he cannot tolerate it due to feelings of claustrophobia and issues with the mask dislodging during sleep. He is not currently using CPAP at all.  He has a history of a pulmonary embolism, which occurred in June or July of the previous year, preventing him from flying for six months. He was on Eliquis  until January of this year. He denies any history of stroke or heart attack, although he mentions a past incident of blacking out while walking down stairs. He is scheduled for an MRI on the 22nd of this month to further investigate the cause of the clot. Unsure if saw Cardiology in the past. He has hx of pulmonary cysts, has an MRI coming up. No hx of smoking or alcohol use in the past.    He has no history of insomnia requiring medication, but he describes  difficulty falling asleep, often staying awake until late at night and waking up early, leading to significant fatigue during the day.  He has requested a hearing test due to concerns about his hearing. No ringing in the ears.   Records Reviewed:  D/c summary 07/08/22 63 y.o. male with medical history significant for HTN, HLD and OSA with CPAP intolerance who presents to the ED with worsening right lower chest/RUQ pain and around the right scapula with associated SOB X 2 days. Pt denies any recent surgery, long distance travel (although he reports commuting to work for about an hour to go on an hour to come back),  other prolonged immobilization.  He denies personal history of VTE but states that his mother developed VTE while she was hospitalized, and a nephew also had clots but the circumstances are unclear.  In the ED, vital signs fairly stable, D-dimer 0.61. CTA chest is notable for acute PE with small clot burden, no evidence for right heart strain, and right middle lobe airspace disease that is most likely infarction.  Thin-walled air cysts are also noted in bilateral lungs on CT. Patient was started on IV heparin  in the ED and admitted for further management.     Past Medical History:  Diagnosis Date   Abnormal EKG    Dizziness    hx   ED (erectile dysfunction)    Hypercholesterolemia    Hypertension    Insomnia    OSA on CPAP  moderate obstructive sleep apnea with an AHI of 25.7/h with no significant central events.  He underwent CPAP titration to 13 cm H2O. Patient does not use CPAP   Pre-diabetes 06/06/2023   no meds, does not  check blood sugar   Pulmonary embolism (HCC)    06/2022   Upper back pain     Past Surgical History:  Procedure Laterality Date   BACK SURGERY     COLONOSCOPY      Family History  Problem Relation Age of Onset   Cancer Father    Cancer Sister    Hypertension Brother    Diabetes Brother    Cancer Brother    Kidney disease Brother     Social  History:  reports that he has never smoked. He has never used smokeless tobacco. He reports that he does not drink alcohol and does not use drugs.  Allergies: No Known Allergies  Medications: I have reviewed the patient's current medications.  The PMH, PSH, Medications, Allergies, and SH were reviewed and updated.  ROS: Constitutional: Negative for fever, weight loss and weight gain. Cardiovascular: Negative for chest pain and dyspnea on exertion. Respiratory: Is not experiencing shortness of breath at rest. Gastrointestinal: Negative for nausea and vomiting. Neurological: Negative for headaches. Psychiatric: The patient is not nervous/anxious  There were no vitals taken for this visit. There is no height or weight on file to calculate BMI.  BMI   PHYSICAL EXAM:  Exam: General: Well-developed, well-nourished Respiratory Respiratory effort: Equal inspiration and expiration without stridor Cardiovascular Peripheral Vascular: Warm extremities with equal color/perfusion Eyes: No nystagmus with equal extraocular motion bilaterally Neuro/Psych/Balance: Patient oriented to person, place, and time; Appropriate mood and affect; Gait is intact with no imbalance; Cranial nerves I-XII are intact Head and Face Inspection: Normocephalic and atraumatic without mass or lesion Palpation: Facial skeleton intact without bony stepoffs Salivary Glands: No mass or tenderness Facial Strength: Facial motility symmetric and full bilaterally ENT Pinna: External ear intact and fully developed External canal: Canal is patent with intact skin Tympanic Membrane: Clear and mobile External Nose: No scar or anatomic deformity Internal Nose: Septum is S-shaped. No polyp, or purulence. Mucosal edema and erythema present.  Bilateral inferior turbinate hypertrophy.  Lips, Teeth, and gums: Mucosa and teeth intact and viable TMJ: No pain to palpation with full mobility Oral cavity/oropharynx: No erythema or  exudate, no lesions present Friedman III tongue position, no tonsils  Prominent base of the tongue  Neck Neck and Trachea: Midline trachea without mass or lesion Thyroid: No mass or nodularity Lymphatics: No lymphadenopathy   Studies Reviewed: Split night sleep study 11/29/21 IMPRESSIONS - Moderate obstructive sleep apnea occurred during the diagnostic portion of the study (AHI = 25.7/hour). An optimal PAP pressure was selected for this patient ( 13 cm of water) - No significant central sleep apnea occurred during the diagnostic portion of the study (CAI = 0.9/hour). - The patient had minimal or no oxygen desaturation during the diagnostic portion of the study (Min O2 = 82.0%) - The patient snored with loud snoring volume during the diagnostic portion of the study. - No cardiac abnormalities were noted during this study. - Clinically significant periodic limb movements did not occur during sleep. BMI 29  Audiogram  06/20/23 - normal hearing Type A tymps AU  Assessment/Plan: Encounter Diagnoses  Name Primary?   Normal hearing test of both ears Yes   OSA (obstructive sleep apnea)    Hypertrophy of both inferior nasal turbinates  Intolerance of continuous positive airway pressure (CPAP) ventilation    Nasal septal deviation    Environmental and seasonal allergies    Post-nasal drip      Assessment and Plan Assessment & Plan Obstructive Sleep Apnea, Severe, CPAP intolerance  Severe obstructive sleep apnea with CPAP non-compliance. Discussed Inspire implant as alternative treatment. Suitable BMI for procedure. No central apneas on sleep study in 2023. Explained drug-induced sleep endoscopy necessity for soft palate collapse assessment. Informed about procedure details, success rate, and recovery.  OSA, severe, without multilevel collapse, with failure to tolerate PAP therapy and/or more conservative measures. Presence of smaller/absent tonsils and larger tongue position (Friedman  tongue position or modified Mallampati) suggests that hypopharyngeal/retrolingual collapse is contributing to the patient's OSA. Janeth, M et al. Staging of obstructive sleep apnea/hypopnea syndrome: a guide to appropriate treatment. Laryngoscope, 2004 Mar, 114(3):454-9. PMID: [id number]) Options including positional therapy, weight loss, oral appliances, PAP and surgical correction discussed. Pt is not ideal candidate for oral appliance due to severity of OSA Pt could be a candidate for Hypoglossal nerve stimulation (Inspire therapy) pending DISE results    - Refer to sleep medicine for post-implant management and insomnia evaluation. - Provided Inspire implant brochure and web link. - Schedule Inspire implant procedure upon insurance approval. Might require cardiac clearance 2/2 hx of DVT and syncope   Pulmonary Embolism History of pulmonary embolism treated with Eliquis  until January 2025. No current anticoagulation therapy. No stroke or myocardial infarction history.  Decreased hearing  - schedule audiogram   Follow-up Follow-up includes scheduling of procedures and referrals. - Schedule hearing test. - Schedule drug-induced sleep endoscopy. - Referral to [facility] for sleep medicine consultation.  Update 08/16/2023 OSA, CPAP intolerance We discussed success rate and insurance coverage as these were his questions. He would like to discuss with his wife, and will call us  if he decides to proceed with DISE.  - contact us  if he decides to move forward with DISE - contact [facility] to schedule repeat sleep study   Hearing changes Audio 06/20/23 was normal. We discussed results and patient was reassured  Elena Larry, MD Otolaryngology [facility] Phone: [phone] Fax: [phone]    08/16/2023, 2:42 PM

## 2023-08-20 DIAGNOSIS — F515 Nightmare disorder: Secondary | ICD-10-CM | POA: Diagnosis not present

## 2023-08-20 DIAGNOSIS — G4700 Insomnia, unspecified: Secondary | ICD-10-CM | POA: Diagnosis not present

## 2023-08-20 DIAGNOSIS — G4752 REM sleep behavior disorder: Secondary | ICD-10-CM | POA: Diagnosis not present

## 2023-08-20 DIAGNOSIS — G4733 Obstructive sleep apnea (adult) (pediatric): Secondary | ICD-10-CM | POA: Diagnosis not present

## 2023-10-04 DIAGNOSIS — E7800 Pure hypercholesterolemia, unspecified: Secondary | ICD-10-CM | POA: Diagnosis not present

## 2023-10-04 DIAGNOSIS — I10 Essential (primary) hypertension: Secondary | ICD-10-CM | POA: Diagnosis not present

## 2023-10-04 DIAGNOSIS — G4700 Insomnia, unspecified: Secondary | ICD-10-CM | POA: Diagnosis not present

## 2023-10-04 DIAGNOSIS — Z125 Encounter for screening for malignant neoplasm of prostate: Secondary | ICD-10-CM | POA: Diagnosis not present

## 2023-10-04 DIAGNOSIS — N529 Male erectile dysfunction, unspecified: Secondary | ICD-10-CM | POA: Diagnosis not present

## 2023-10-04 DIAGNOSIS — Z1331 Encounter for screening for depression: Secondary | ICD-10-CM | POA: Diagnosis not present

## 2023-10-04 DIAGNOSIS — Z23 Encounter for immunization: Secondary | ICD-10-CM | POA: Diagnosis not present

## 2023-10-04 DIAGNOSIS — R7303 Prediabetes: Secondary | ICD-10-CM | POA: Diagnosis not present

## 2023-10-04 DIAGNOSIS — Z0000 Encounter for general adult medical examination without abnormal findings: Secondary | ICD-10-CM | POA: Diagnosis not present

## 2023-10-31 DIAGNOSIS — I10 Essential (primary) hypertension: Secondary | ICD-10-CM | POA: Diagnosis not present

## 2023-10-31 DIAGNOSIS — F5104 Psychophysiologic insomnia: Secondary | ICD-10-CM | POA: Diagnosis not present

## 2023-10-31 DIAGNOSIS — G4752 REM sleep behavior disorder: Secondary | ICD-10-CM | POA: Diagnosis not present

## 2023-10-31 DIAGNOSIS — G4733 Obstructive sleep apnea (adult) (pediatric): Secondary | ICD-10-CM | POA: Diagnosis not present

## 2023-12-15 DIAGNOSIS — G4733 Obstructive sleep apnea (adult) (pediatric): Secondary | ICD-10-CM | POA: Diagnosis not present

## 2024-01-14 DIAGNOSIS — G4733 Obstructive sleep apnea (adult) (pediatric): Secondary | ICD-10-CM | POA: Diagnosis not present
